# Patient Record
Sex: Female | Born: 1985 | Race: White | Hispanic: No | Marital: Married | Smoking: Current every day smoker
Health system: Southern US, Community
[De-identification: ages and names within clinical notes are randomized; demographics above are authoritative.]

## PROBLEM LIST (undated history)

## (undated) DIAGNOSIS — J45909 Unspecified asthma, uncomplicated: Secondary | ICD-10-CM

## (undated) DIAGNOSIS — F1911 Other psychoactive substance abuse, in remission: Secondary | ICD-10-CM

## (undated) DIAGNOSIS — Z006 Encounter for examination for normal comparison and control in clinical research program: Principal | ICD-10-CM

## (undated) DIAGNOSIS — F17200 Nicotine dependence, unspecified, uncomplicated: Secondary | ICD-10-CM

## (undated) HISTORY — DX: Other psychoactive substance abuse, in remission: F19.11

## (undated) HISTORY — DX: Unspecified asthma, uncomplicated: J45.909

## (undated) HISTORY — DX: Encounter for examination for normal comparison and control in clinical research program: Z00.6

## (undated) HISTORY — DX: Nicotine dependence, unspecified, uncomplicated: F17.200

---

## 2005-02-03 ENCOUNTER — Emergency Department (HOSPITAL_COMMUNITY): Admission: EM | Admit: 2005-02-03 | Discharge: 2005-02-03 | Payer: Self-pay | Admitting: Emergency Medicine

## 2011-06-22 ENCOUNTER — Telehealth: Payer: Self-pay | Admitting: *Deleted

## 2011-08-24 NOTE — Telephone Encounter (Signed)
No note

## 2014-08-07 ENCOUNTER — Emergency Department (HOSPITAL_BASED_OUTPATIENT_CLINIC_OR_DEPARTMENT_OTHER)
Admission: EM | Admit: 2014-08-07 | Discharge: 2014-08-07 | Disposition: A | Discharge status: Home / Self Care | Payer: Self-pay | Attending: Emergency Medicine | Admitting: Emergency Medicine

## 2014-08-07 ENCOUNTER — Encounter (HOSPITAL_BASED_OUTPATIENT_CLINIC_OR_DEPARTMENT_OTHER): Payer: Self-pay | Admitting: Emergency Medicine

## 2014-08-07 DIAGNOSIS — K029 Dental caries, unspecified: Secondary | ICD-10-CM | POA: Insufficient documentation

## 2014-08-07 DIAGNOSIS — Z87891 Personal history of nicotine dependence: Secondary | ICD-10-CM | POA: Insufficient documentation

## 2014-08-07 MED ORDER — PENICILLIN V POTASSIUM 250 MG PO TABS
500.0000 mg | ORAL_TABLET | Freq: Once | ORAL | Status: AC
  Administered 2014-08-07: 500 mg via ORAL

## 2014-08-07 MED ORDER — PENICILLIN V POTASSIUM 500 MG PO TABS: 500.0000 mg | ORAL_TABLET | Freq: Four times a day (QID) | ORAL | Status: AC

## 2014-08-07 MED ORDER — MELOXICAM 15 MG PO TABS: 15.0000 mg | ORAL_TABLET | Freq: Every day | ORAL | Status: DC

## 2014-08-07 MED ORDER — PENICILLIN V POTASSIUM 250 MG PO TABS
ORAL_TABLET | ORAL | Status: AC
  Administered 2014-08-07: 500 mg via ORAL
  Filled 2014-08-07: qty 2

## 2014-08-07 NOTE — ED Notes (Signed)
Pt reports that [facility]

## 2014-08-07 NOTE — ED Notes (Signed)
Pt reports left bottom tooth pain x 2 days, nonresponsive to 800 ibuprofen

## 2014-08-07 NOTE — ED Provider Notes (Signed)
CSN: [id number]     Arrival date & time 08/07/14  0435 History   First MD Initiated Contact with Patient 08/07/14 0446     Chief Complaint  Patient presents with  . Dental Pain     (Consider location/radiation/quality/duration/timing/severity/associated sxs/prior Treatment) Patient is a 29 y.o. female presenting with tooth pain. The history is provided by the patient.  Dental Pain Location:  Lower Lower teeth location:  19/LL 1st molar, 20/LL 2nd bicuspid, 21/LL 1st bicuspid, 22/LL cuspid, 23/LL lateral incisor, 24/LL central incisor, 25/RL central incisor, 26/RL lateral incisor, 27/RL cuspid, 28/RL 1st bicuspid and 29/RL 2nd bicuspid Quality:  Dull Severity:  Severe Onset quality:  Gradual Timing:  Constant Progression:  Worsening Chronicity:  Recurrent Context: poor dentition   Previous work-up:  Dental exam Relieved by:  Nothing Worsened by:  Nothing tried Ineffective treatments:  NSAIDs Associated symptoms: no congestion, no fever, no neck swelling and no trismus   Risk factors: periodontal disease   Risk factors: no alcohol problem     History reviewed. No pertinent past medical history. History reviewed. No pertinent past surgical history. History reviewed. No pertinent family history. History  Substance Use Topics  . Smoking status: Former Smoker -- 0.50 packs/day    Types: Cigarettes  . Smokeless tobacco: Not on file  . Alcohol Use: No   OB History    No data available     Review of Systems  Constitutional: Negative for fever.  HENT: Positive for dental problem. Negative for congestion.   All other systems reviewed and are negative.     Allergies  Review of patient's allergies indicates no known allergies.  Home Medications   Prior to Admission medications   Not on File   BP 134/86 mmHg  Pulse 89  Temp(Src) 98.6 F (37 C) (Oral)  Resp 18  Ht  (1.727 m)  Wt 150 lb (68.04 kg)  BMI 22.81 kg/m2  SpO2 98%  LMP 07/31/2014 Physical Exam   Constitutional: She is oriented to person, place, and time. She appears well-developed and well-nourished. No distress.  HENT:  Head: Normocephalic and atraumatic.  Mouth/Throat: Oropharynx is clear and moist.  Widespread necrosis with teeth at gum line  Eyes: Conjunctivae and EOM are normal. Pupils are equal, round, and reactive to light.  Neck: Normal range of motion. Neck supple. No tracheal deviation present.  Cardiovascular: Normal rate, regular rhythm and intact distal pulses.   Pulmonary/Chest: Effort normal and breath sounds normal. No respiratory distress. She has no wheezes. She has no rales.  Abdominal: Soft. Bowel sounds are normal. There is no tenderness. There is no rebound and no guarding.  Musculoskeletal: Normal range of motion.  Neurological: She is alert and oriented to person, place, and time.  Skin: Skin is warm and dry.  Psychiatric: She has a normal mood and affect.    ED Course  Procedures (including critical care time) Labs Review Labs Reviewed - No data to display  Imaging Review No results found.   EKG Interpretation None      MDM   Final diagnoses:  None    Attempted numbing patient unable to tolerate  PEN VK and mobic and follow up with a dentist   Luqman Perrelli, MD 08/07/14 [phone]

## 2014-08-07 NOTE — Discharge Instructions (Signed)
Dental Care and Dentist Visits  Dental care supports good overall health. Regular dental visits can also help you avoid dental pain, bleeding, infection, and other more serious health problems in the future. It is important to keep the mouth healthy because diseases in the teeth, gums, and other oral tissues can spread to other areas of the body. Some problems, such as diabetes, heart disease, and pre-term labor have been associated with poor oral health.   See your dentist every 6 months. If you experience emergency problems such as a toothache or broken tooth, go to the dentist right away. If you see your dentist regularly, you may catch problems early. It is easier to be treated for problems in the early stages.   WHAT TO EXPECT AT A DENTIST VISIT   Your dentist will look for many common oral health problems and recommend proper treatment. At your regular dental visit, you can expect:  · Gentle cleaning of the teeth and gums. This includes scraping and polishing. This helps to remove the sticky substance around the teeth and gums (plaque). Plaque forms in the mouth shortly after eating. Over time, plaque hardens on the teeth as tartar. If tartar is not removed regularly, it can cause problems. Cleaning also helps remove stains.  · Periodic X-rays. These pictures of the teeth and supporting bone will help your dentist assess the health of your teeth.  · Periodic fluoride treatments. Fluoride is a natural mineral shown to help strengthen teeth. Fluoride treatment involves applying a fluoride gel or varnish to the teeth. It is most commonly done in children.  · Examination of the mouth, tongue, jaws, teeth, and gums to look for any oral health problems, such as:  ¨ Cavities (dental caries). This is decay on the tooth caused by plaque, sugar, and acid in the mouth. It is best to catch a cavity when it is small.  ¨ Inflammation of the gums caused by plaque buildup (gingivitis).  ¨ Problems with the mouth or malformed  or misaligned teeth.  ¨ Oral cancer or other diseases of the soft tissues or jaws.   KEEP YOUR TEETH AND GUMS HEALTHY  For healthy teeth and gums, follow these general guidelines as well as your dentist's specific advice:  · Have your teeth professionally cleaned at the dentist every 6 months.  · Brush twice daily with a fluoride toothpaste.  · Floss your teeth daily.   · Ask your dentist if you need fluoride supplements, treatments, or fluoride toothpaste.  · Eat a healthy diet. Reduce foods and drinks with added sugar.  · Avoid smoking.  TREATMENT FOR ORAL HEALTH PROBLEMS  If you have oral health problems, treatment varies depending on the conditions present in your teeth and gums.  · Your caregiver will most likely recommend good oral hygiene at each visit.  · For cavities, gingivitis, or other oral health disease, your caregiver will perform a procedure to treat the problem. This is typically done at a separate appointment. Sometimes your caregiver will refer you to another dental specialist for specific tooth problems or for surgery.  SEEK IMMEDIATE DENTAL CARE IF:  · You have pain, bleeding, or soreness in the gum, tooth, jaw, or mouth area.  · A permanent tooth becomes loose or separated from the gum socket.  · You experience a blow or injury to the mouth or jaw area.  Document Released: 09/15/2010 Document Revised: 03/28/2011 Document Reviewed: 09/15/2010  ExitCare® Patient Information ©2015 [employer]. This information is not intended to replace advice   given to you by your health care provider. Make sure you discuss any questions you have with your health care provider.

## 2015-03-29 ENCOUNTER — Encounter (HOSPITAL_BASED_OUTPATIENT_CLINIC_OR_DEPARTMENT_OTHER): Payer: Self-pay | Admitting: Emergency Medicine

## 2015-03-29 ENCOUNTER — Emergency Department (HOSPITAL_BASED_OUTPATIENT_CLINIC_OR_DEPARTMENT_OTHER)
Admission: EM | Admit: 2015-03-29 | Discharge: 2015-03-29 | Disposition: A | Discharge status: Home / Self Care | Payer: Self-pay | Attending: Emergency Medicine | Admitting: Emergency Medicine

## 2015-03-29 DIAGNOSIS — Z791 Long term (current) use of non-steroidal anti-inflammatories (NSAID): Secondary | ICD-10-CM | POA: Insufficient documentation

## 2015-03-29 DIAGNOSIS — K029 Dental caries, unspecified: Secondary | ICD-10-CM | POA: Insufficient documentation

## 2015-03-29 DIAGNOSIS — F17210 Nicotine dependence, cigarettes, uncomplicated: Secondary | ICD-10-CM | POA: Insufficient documentation

## 2015-03-29 DIAGNOSIS — K047 Periapical abscess without sinus: Secondary | ICD-10-CM | POA: Insufficient documentation

## 2015-03-29 MED ORDER — OXYCODONE-ACETAMINOPHEN 5-325 MG PO TABS
2.0000 | ORAL_TABLET | Freq: Once | ORAL | Status: AC
Start: 2015-03-29 — End: 2015-03-29
  Administered 2015-03-29: 2 via ORAL
  Filled 2015-03-29: qty 2

## 2015-03-29 MED ORDER — OXYCODONE-ACETAMINOPHEN 5-325 MG PO TABS: 1.0000 | ORAL_TABLET | ORAL | Status: AC | PRN

## 2015-03-29 MED ORDER — AMOXICILLIN 500 MG PO CAPS: 500.0000 mg | ORAL_CAPSULE | Freq: Three times a day (TID) | ORAL | Status: AC

## 2015-03-29 NOTE — ED Notes (Signed)
Patient has left jaw pain and swelling

## 2015-03-29 NOTE — ED Provider Notes (Signed)
CSN: [id number]     Arrival date & time 03/29/15  1509 History   First MD Initiated Contact with Patient 03/29/15 1710     Chief Complaint  Patient presents with  . Dental Pain     (Consider location/radiation/quality/duration/timing/severity/associated sxs/prior Treatment) HPI   This is a 30 year old female who presents emergency Department with chief complaint of dental pain. She has very poor dentition and multiple dental caries. The majority of her bottom teeth are decayed to the, line. She is a current daily smoker. She complains of pain developing at tooth #7. Yesterday morning. At first it was mildly achy, overnight, it has swollen significantly. She claims a severe pressure-like pain and swelling below the tooth. She rates the pain at 10 out of 10. She has had no drainage from the tooth. It feels that there is an abscess present. She denies difficulty swallowing or breathing. She's had multiple dental abscesses before due to poor dentition. Patient is a current daily smoker.   History reviewed. No pertinent past medical history. History reviewed. No pertinent past surgical history. History reviewed. No pertinent family history. Social History  Substance Use Topics  . Smoking status: Current Every Day Smoker -- 0.50 packs/day    Types: Cigarettes  . Smokeless tobacco: None  . Alcohol Use: No   OB History    No data available     Review of Systems  Ten systems reviewed and are negative for acute change, except as noted in the HPI.    Allergies  Review of patient's allergies indicates no known allergies.  Home Medications   Prior to Admission medications   Medication Sig Start Date End Date Taking? Authorizing Provider  amoxicillin (AMOXIL) 500 MG capsule Take 1 capsule (500 mg total) by mouth 3 (three) times daily. 03/29/15   Arthor Captain, PA-C  meloxicam (MOBIC) 15 MG tablet Take 1 tablet (15 mg total) by mouth daily. 08/07/14   April Palumbo, MD    oxyCODONE-acetaminophen (PERCOCET) 5-325 MG tablet Take 1-2 tablets by mouth every 4 (four) hours as needed. 03/29/15   Kasia Trego, PA-C   BP 120/72 mmHg  Pulse 94  Temp(Src) 99.1 F (37.3 C) (Oral)  Resp 18  Ht  (1.727 m)  Wt 73.483 kg  BMI 24.64 kg/m2  SpO2 98% Physical Exam  Constitutional: She is oriented to person, place, and time. She appears well-developed and well-nourished. No distress.  HENT:  Head: Normocephalic and atraumatic.  Mouth/Throat: Uvula is midline. No trismus in the jaw.    Oropharynx clear and moist'  Eyes: Conjunctivae are normal. No scleral icterus.  Neck: Normal range of motion.  Cardiovascular: Normal rate, regular rhythm and normal heart sounds.  Exam reveals no gallop and no friction rub.   No murmur heard. Pulmonary/Chest: Effort normal and breath sounds normal. No respiratory distress.  Abdominal: Soft. Bowel sounds are normal. She exhibits no distension and no mass. There is no tenderness. There is no guarding.  Neurological: She is alert and oriented to person, place, and time.  Skin: Skin is warm and dry. She is not diaphoretic.    ED Course  Procedures (including critical care time) Labs Review Labs Reviewed - No data to display  Imaging Review No results found. I have personally reviewed and evaluated these images and lab results as part of my medical decision-making.   EKG Interpretation None      MDM   Final diagnoses:  Dental infection    Patient with toothache.  No gross  abscess.  Exam unconcerning for Ludwig's angina or spread of infection.  Will treat with penicillin and pain medicine.  Urged patient to follow-up with dentist.       Arthor CaptainAbigail Shaneeka Scarboro, PA-C 03/29/15 1814  Jerelyn ScottMartha Linker, MD 03/29/15 1815

## 2015-03-29 NOTE — Discharge Instructions (Signed)
Dental Abscess A dental abscess is a collection of pus in or around a tooth. CAUSES This condition is caused by a bacterial infection around the root of the tooth that involves the inner part of the tooth (pulp). It may result from:  Severe tooth decay.  Trauma to the tooth that allows bacteria to enter into the pulp, such as a broken or chipped tooth.  Severe gum disease around a tooth. SYMPTOMS Symptoms of this condition include:  Severe pain in and around the infected tooth.  Swelling and redness around the infected tooth, in the mouth, or in the face.  Tenderness.  Pus drainage.  Bad breath.  Bitter taste in the mouth.  Difficulty swallowing.  Difficulty opening the mouth.  Nausea.  Vomiting.  Chills.  Swollen neck glands.  Fever. DIAGNOSIS This condition is diagnosed with examination of the infected tooth. During the exam, your dentist may tap on the infected tooth. Your dentist will also ask about your medical and dental history and may order X-rays. TREATMENT This condition is treated by eliminating the infection. This may be done with:  Antibiotic medicine.  A root canal. This may be performed to save the tooth.  Pulling (extracting) the tooth. This may also involve draining the abscess. This is done if the tooth cannot be saved. HOME CARE INSTRUCTIONS  Take medicines only as directed by your dentist.  If you were prescribed antibiotic medicine, finish all of it even if you start to feel better.  Rinse your mouth (gargle) often with salt water to relieve pain or swelling.  Do not drive or operate heavy machinery while taking pain medicine.  Do not apply heat to the outside of your mouth.  Keep all follow-up visits as directed by your dentist. This is important. SEEK MEDICAL CARE IF:  Your pain is worse and is not helped by medicine. SEEK IMMEDIATE MEDICAL CARE IF:  You have a fever or chills.  Your symptoms suddenly get worse.  You have a  very bad headache.  You have problems breathing or swallowing.  You have trouble opening your mouth.  You have swelling in your neck or around your eye.   This information is not intended to replace advice given to you by your health care provider. Make sure you discuss any questions you have with your health care provider.   Document Released: 01/03/2005 Document Revised: 05/20/2014 Document Reviewed: 12/31/2013 Elsevier Interactive Patient Education 2016 Elsevier Inc.  [facility] [employer]  [employer]  [employer] [facility]  [address]  [place], [place] [address]  Phone [phone]  [facility] [employer] [facility] [place] exemplifies the [employer] vision to improve the health and quality of life of all [place] by Public house managercreating leaders with a passion to care for the underserved and by leading the nation in community-based, service learning oral health education. We are committed to offering comprehensive general dental services for adults, children and special needs patients in a safe, caring and professional setting.  Appointments: Our clinic is open Monday through Friday 8:00 a.m. until 5:00 p.m. The amount of time scheduled for an appointment depends on the patients specific needs. We ask that you keep your appointed time for care or provide 24-hour notice of all appointment changes. Parents or legal guardians must accompany minor children.  Payment for Services: Medicaid and other insurance plans are welcome. Payment for services is due when services are  rendered and may be made by cash or credit card. If you have dental insurance, we will assist you with your claim submission.   Emergencies: Emergency services will be provided Monday through Friday on a walk-in basis. Please arrive early for emergency services. After hours emergency services  will be provided for patients of record as required.  Services:  Medical illustratorComprehensive General Dentistry  Childrens Dentistry  Oral Surgery - Extractions  Root Canals  Sealants and Tooth Colored Fillings  Crowns and Bridges  Dentures and Partial Dentures  Implant Services  Periodontal Services and Cleanings  Cosmetic Building services engineerTooth Whitening  Digital Radiography  3-D/[facility] Imaging

## 2016-10-24 ENCOUNTER — Ambulatory Visit: Payer: Self-pay | Attending: Podiatry | Admitting: Sports Medicine

## 2016-10-24 DIAGNOSIS — M79672 Pain in left foot: Secondary | ICD-10-CM

## 2016-10-24 DIAGNOSIS — M79671 Pain in right foot: Secondary | ICD-10-CM

## 2016-10-24 DIAGNOSIS — M722 Plantar fascial fibromatosis: Secondary | ICD-10-CM

## 2016-10-24 MED ORDER — METHYLPREDNISOLONE 4 MG PO TBPK: ORAL_TABLET | ORAL | 0 refills | Status: AC

## 2016-10-24 MED ORDER — MELOXICAM 15 MG PO TABS: 15.0000 mg | ORAL_TABLET | Freq: Every day | ORAL | 0 refills | Status: AC

## 2016-10-24 NOTE — Patient Instructions (Signed)

## 2016-10-24 NOTE — Progress Notes (Signed)
Subjective: Kelsey Phillips is a 31 y.o. female patient [facility] with complaint of heel pain on the left>right. Patient admits to post static dyskinesia since last Oct after she gained weight. Patient reports that now she still has pain worse in AM and at end of day. Patient has treated this problem with stretching, ice, and insoles with no relief. Reports her PCP gave her oral steroids and shot in hip that helped. Denies any other pedal complaints.   There are no active problems to display for this patient.   Current Outpatient Prescriptions on File Prior to Visit  Medication Sig Dispense Refill  . amoxicillin (AMOXIL) 500 MG capsule Take 1 capsule (500 mg total) by mouth 3 (three) times daily. 30 capsule 0  . oxyCODONE-acetaminophen (PERCOCET) 5-325 MG tablet Take 1-2 tablets by mouth every 4 (four) hours as needed. 20 tablet 0   No current facility-administered medications on file prior to visit.     No Known Allergies  Objective: Physical Exam General: The patient is alert and oriented x3 in no acute distress.  Dermatology: Skin is warm, dry and supple bilateral lower extremities. Nails 1-10 are normal. There is no erythema, edema, no eccymosis, no open lesions present. Integument is otherwise unremarkable.  Vascular: Dorsalis Pedis pulse and Posterior Tibial pulse are 1/4 bilateral. Capillary fill time is immediate to all digits.  Neurological: Grossly intact to light touch with an achilles reflex of +2/5 and a  negative Tinel's sign bilateral.  Musculoskeletal: Tenderness to palpation at the medial calcaneal tubercale and through the insertion of the plantar fascia on the left>right foot. No pain with compression of calcaneus bilateral. No pain with tuning fork to calcaneus bilateral. No pain with calf compression bilateral. There is decreased Ankle joint range of motion bilateral. All other joints range of motion within normal limits bilateral. Strength 5/5 in all groups  bilateral.    Assessment and Plan: Problem List Items Addressed This Visit    None    Visit Diagnoses    Plantar fasciitis    -  Primary   Relevant Medications   meloxicam (MOBIC) 15 MG tablet   methylPREDNISolone (MEDROL DOSEPAK) 4 MG TBPK tablet   Foot pain, bilateral          -Complete examination performed.  -Discussed with patient in detail the condition of plantar fasciitis, how this occurs and general treatment options. Explained both conservative and surgical treatments.  -After oral consent and aseptic prep, injected a mixture containing 1 ml of 2%  plain lidocaine, 1 ml 0.5% plain marcaine, 0.5 ml of kenalog 10 and 0.5 ml of dexamethasone phosphate into left and right heel. Post-injection care discussed with patient.  -Rx Meloxicam to start after Medrol dose pack is completed -Recommended good supportive shoes and advised use of OTC insert. -Explained and dispensed to patient daily stretching exercises. -Recommend patient to ice affected area 1-2x daily. -Patient to return to free clinic as needed  Asencion Islam, DPM

## 2016-10-25 ENCOUNTER — Encounter: Payer: Self-pay | Admitting: Internal Medicine

## 2016-11-02 ENCOUNTER — Encounter: Payer: Self-pay | Admitting: Internal Medicine

## 2016-11-02 ENCOUNTER — Ambulatory Visit (INDEPENDENT_AMBULATORY_CARE_PROVIDER_SITE_OTHER): Payer: Self-pay | Admitting: Internal Medicine

## 2016-11-02 DIAGNOSIS — F1910 Other psychoactive substance abuse, uncomplicated: Secondary | ICD-10-CM

## 2016-11-02 DIAGNOSIS — B182 Chronic viral hepatitis C: Secondary | ICD-10-CM | POA: Insufficient documentation

## 2016-11-02 NOTE — Progress Notes (Signed)
[facility]   CC: consideration for treatment for chronic hepatitis C  HPI:  +Kelsey Phillips is a 31 y.o. female who presents for initial evaluation and management of chronic hepatitis C.  Patient tested positive earlier this year. Hepatitis C-associated risk factors present are: IV drug abuse (details: off IVDU for 1 year). Patient denies multiple sexual partners, sexual contact with person with liver disease. Patient has had other studies performed. Results: hepatitis C RNA by PCR, result: positive. Patient has not had prior treatment for Hepatitis C. Patient does not have a past history of liver disease. Patient does have a family history of liver disease. Patient does not  have associated signs or symptoms related to liver disease.  Labs reviewed and confirm chronic hepatitis C with a positive viral load.   Records reviewed from her PCP and no other labs done yet.        Patient does not have documented immunity to Hepatitis A. Patient does not have documented immunity to Hepatitis B.    Review of Systems:  Constitutional: negative for fatigue and malaise Gastrointestinal: negative for diarrhea Integument/breast: negative for rash All other systems reviewed and are negative       PMH: history of substance abuse  Prior to Admission medications   Medication Sig Start Date End Date Taking? Authorizing Provider  amoxicillin (AMOXIL) 500 MG capsule Take 1 capsule (500 mg total) by mouth 3 (three) times daily. 03/29/15   Arthor Captain, PA-C  meloxicam (MOBIC) 15 MG tablet Take 1 tablet (15 mg total) by mouth daily. 10/24/16   Asencion Islam, DPM  methylPREDNISolone (MEDROL DOSEPAK) 4 MG TBPK tablet Take as instructed 10/24/16   Asencion Islam, DPM  oxyCODONE-acetaminophen (PERCOCET) 5-325 MG tablet Take 1-2 tablets by mouth every 4 (four) hours as needed. 03/29/15   Arthor Captain, PA-C    No Known Allergies  Social History  Substance Use Topics  .  Smoking status: Current Every Day Smoker    Packs/day: 0.50    Types: Cigarettes  . Smokeless tobacco: Not on file  . Alcohol use No    FMH: grandmother with alcoholic cirrhosis and liver cancer   Objective:  Constitutional: in no apparent distress and alert,  Vitals:   11/02/16 1450  Weight: 209 lb (94.8 kg)   Eyes: anicteric Cardiovascular: Cor RRR Respiratory: CTA B; normal respiratory effort Gastrointestinal: Bowel sounds are normal, liver is not enlarged, spleen is not enlarged Musculoskeletal: no pedal edema noted Skin: negatives: no rash; no porphyria cutanea tarda Lymphatic: no cervical lymphadenopathy   Laboratory Genotype: No results found for: HCVGENOTYPE HCV viral load: No results found for: HCVQUANT No results found for: WBC, HGB, HCT, MCV, PLT No results found for: CREATININE, BUN, NA, K, CL, CO2 No results found for: ALT, AST, GGT, ALKPHOS   Labs and history reviewed and show CHILD-PUGH unknown  5-6 points: Child class A 7-9 points: Child class B 10-15 points: Child class C  No results found for: INR, BILITOT, ALBUMIN   Assessment: New Patient with Chronic Hepatitis C genotype unknown, untreated.  I discussed with the patient the lab findings that confirm chronic hepatitis C as well as the natural history and progression of disease including about 30% of people who develop cirrhosis of the liver if left untreated and once cirrhosis is established there is a 2-7% risk per year of liver cancer and liver failure.  I discussed the importance of treatment and benefits in reducing the risk, even if  significant liver fibrosis exists.   Plan: 1) Patient counseled extensively on limiting acetaminophen to no more than 2 grams daily, avoidance of alcohol. 2) Transmission discussed with patient including sexual transmission, sharing razors and toothbrush.   3) Will need referral to gastroenterology if concern for cirrhosis  I discussed our research trial on monitoring  and she is interested and opted to get enrolled in the study so she will continue to follow with our research staff.

## 2016-11-02 NOTE — Patient Instructions (Signed)
Date 11/02/16  Dear Kelsey Phillips, As discussed in the ID Clinic, your hepatitis C therapy will include highly effective medication(s) for treatment and will vary based on the type of hepatitis C and insurance approval.  Potential medications include:          Harvoni (sofosbuvir 90mg /ledipasvir 400mg ) tablet oral daily          OR     Epclusa (sofosbuvir 400mg /velpatasvir 100mg ) tablet oral daily          OR      Mavyret (glecaprevir 100 mg/pibrentasvir 40 mg): Take 3 tablets oral daily          OR     Zepatier (elbasvir 50 mg/grazoprevir 100 mg) oral daily, +/- ribavirin              Medications are typically for 8 or 12 weeks total ---------------------------------------------------------------- Your HCV Treatment Start Date: You will be notified by our office once the medication is approved and where you can pick it up (or if mailed)   ---------------------------------------------------------------- YOUR PHARMACY CONTACT:   [facility] [address] [place], [place] [address] Phone: [phone] Hours: Monday to Friday 7:30 am to 6:00 pm   Please always contact your pharmacy at least 3-4 business days before you run out of medications to ensure your next month's medication is ready or 1 week prior to running out if you receive it by mail.  Remember, each prescription is for 28 days. ---------------------------------------------------------------- GENERAL NOTES REGARDING YOUR HEPATITIS C MEDICATION:  Some medications have the following interactions:  - Acid reducing agents such as H2 blockers (ie. Pepcid (famotidine), Zantac (ranitidine), Tagamet (cimetidine), Axid (nizatidine) and proton pump inhibitors (ie. Prilosec (omeprazole), Protonix (pantoprazole), Nexium (esomeprazole), or Aciphex (rabeprazole)). Do not take until you have discussed with a health care provider.    -Antacids that contain magnesium and/or aluminum hydroxide (ie. Milk of Magensia, Rolaids,  Gaviscon, Maalox, Mylanta, an dArthritis Pain Formula).  -Calcium carbonate (calcium supplements or antacids such as Tums, Caltrate, Os-Cal).  -St. John's wort or any products that contain St. John's wort like some herbal supplements  Please inform the office prior to starting any of these medications.  - The common side effects associated with Harvoni include:      1. Fatigue      2. Headache      3. Nausea      4. Diarrhea      5. Insomnia  Please note that this only lists the most common side effects and is NOT a comprehensive list of the potential side effects of these medications. For more information, please review the drug information sheets that come with your medication package from the pharmacy.  ---------------------------------------------------------------- GENERAL HELPFUL HINTS ON HCV THERAPY: 1. Stay well-hydrated. 2. Notify the ID Clinic of any changes in your other over-the-counter/herbal or prescription medications. 3. If you miss a dose of your medication, take the missed dose as soon as you remember. Return to your regular time/dose schedule the next day.  4.  Do not stop taking your medications without first talking with your healthcare provider. 5.  You may take Tylenol (acetaminophen), as long as the dose is less than 2000 mg (OR no more than 4 tablets of the Tylenol Extra Strengths 500mg  tablet) in 24 hours. 6.  You will see our pharmacist-specialist within the first 2 weeks of starting your medication to monitor for any possible side effects. 7.  You will have labs once during treatment, soon  after treatment completion and one final lab 6 months after treatment completion to verify the virus is out of your system.  Scharlene Gloss, [place] for [place], [place]  [phone] [phone]

## 2016-11-02 NOTE — Assessment & Plan Note (Signed)
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

## 2016-11-16 ENCOUNTER — Encounter: Payer: Self-pay | Admitting: *Deleted

## 2016-11-30 ENCOUNTER — Encounter: Payer: Self-pay | Admitting: Infectious Disease

## 2016-11-30 ENCOUNTER — Telehealth: Payer: Self-pay | Admitting: Infectious Disease

## 2016-11-30 ENCOUNTER — Encounter (INDEPENDENT_AMBULATORY_CARE_PROVIDER_SITE_OTHER): Payer: Self-pay | Admitting: Infectious Disease

## 2016-11-30 VITALS — BP 120/74 | HR 80 | Temp 98.2°F | Wt 216.0 lb

## 2016-11-30 DIAGNOSIS — B182 Chronic viral hepatitis C: Secondary | ICD-10-CM

## 2016-11-30 DIAGNOSIS — Z87898 Personal history of other specified conditions: Secondary | ICD-10-CM

## 2016-11-30 DIAGNOSIS — J45909 Unspecified asthma, uncomplicated: Secondary | ICD-10-CM | POA: Insufficient documentation

## 2016-11-30 DIAGNOSIS — F17200 Nicotine dependence, unspecified, uncomplicated: Secondary | ICD-10-CM

## 2016-11-30 DIAGNOSIS — Z006 Encounter for examination for normal comparison and control in clinical research program: Secondary | ICD-10-CM

## 2016-11-30 DIAGNOSIS — F1911 Other psychoactive substance abuse, in remission: Secondary | ICD-10-CM

## 2016-11-30 HISTORY — DX: Unspecified asthma, uncomplicated: J45.909

## 2016-11-30 HISTORY — DX: Personal history of other specified conditions: Z87.898

## 2016-11-30 HISTORY — DX: Other psychoactive substance abuse, in remission: F19.11

## 2016-11-30 HISTORY — DX: Nicotine dependence, unspecified, uncomplicated: F17.200

## 2016-11-30 HISTORY — DX: Encounter for examination for normal comparison and control in clinical research program: Z00.6

## 2016-11-30 NOTE — Progress Notes (Signed)
Kelsey Phillips is here for screening visit for A5360, A Single-arm Study to Evaluate the Feasibility and Efficacy of a Minimal Strategy to Deliver Pan-genotypic Ribavirin-free HCV Therapy to HCV Infected Populations who are HCV Treatment naive with Evidence of Active HCV Infection: [facility]. Informed consent was obtained after we reviewed the responsibilities, risks and possible benefits of the study. States that she probably acquired HCV through prior IV drug use. States that she has been clean and not used any IV drugs in 11 months. Has good support from her boyfriend. States that she has never received any treatment for her HCV. CPE done today per Dr. Daiva EvesVan Dam. We have scheduled an US with elastograohy for next week and plan to enroll into study the following week if eligible.

## 2016-11-30 NOTE — Addendum Note (Signed)
Addended by: Osvaldo ShipperASNOIT, Briggs Edelen H on: 11/30/2016 03:44 PM   Modules accepted: Orders

## 2016-11-30 NOTE — Progress Notes (Signed)
Subjective:    Patient ID: Kelsey Phillips, female    DOB: 01/20/1985, 31 y.o.   MRN: [id number]  HPI  Kelsey Phillips is here for an exam for the [facility] MINMON study of Hepatitis C. he has a history of chronic hepatitis C without hepatic coma having acquired this through prior intravenous drug use.  She states she has been clean from IV drugs times 11 months.  She has comorbid asthma and smoking.  She has had baseline labs performed and is here for her exam [facility].  Her husband is also coming to clinic today for screening as well.  She has a little bit of a cough that she attributes to her chronic smoking and asthma but otherwise no physical complaints today.      Past Medical History:  Diagnosis Date  . Asthma 11/30/2016  . Examination of participant in clinical trial 11/30/2016  . History of intravenous drug abuse 11/30/2016  . Smoker 11/30/2016  Chronic hepatitis C without hepatic coma [facility]  No past surgical history on file.  No family history on file.    Social History   Socioeconomic History  . Marital status: Single    Spouse name: Not on file  . Number of children: Not on file  . Years of education: Not on file  . Highest education level: Not on file  Social Needs  . Financial resource strain: Not on file  . Food insecurity - worry: Not on file  . Food insecurity - inability: Not on file  . Transportation needs - medical: Not on file  . Transportation needs - non-medical: Not on file  Occupational History  . Not on file  Tobacco Use  . Smoking status: Current Every Day Smoker    Packs/day: 0.50    Types: Cigarettes  . Smokeless tobacco: Never Used  Substance and Sexual Activity  . Alcohol use: No  . Drug use: Not on file  . Sexual activity: Not on file  Other Topics Concern  . Not on file  Social History Narrative  . Not on file    No Known Allergies   Current Outpatient Medications:  .  amoxicillin  (AMOXIL) 500 MG capsule, Take 1 capsule (500 mg total) by mouth 3 (three) times daily. (Patient not taking: Reported on 11/02/2016), Disp: 30 capsule, Rfl: 0 .  gabapentin (NEURONTIN) 300 MG capsule, Take 300 mg by mouth 2 (two) times daily., Disp: , Rfl: 0 .  lamoTRIgine (LAMICTAL) 25 MG tablet, TAKE 2 Tablets BY MOUTH EVERY MORNING AND TAKE 3 Tablets BY MOUTH EVERY NIGHT AT BEDTIME, Disp: , Rfl: 1 .  meloxicam (MOBIC) 15 MG tablet, Take 1 tablet (15 mg total) by mouth daily., Disp: 30 tablet, Rfl: 0 .  methylPREDNISolone (MEDROL DOSEPAK) 4 MG TBPK tablet, Take as instructed, Disp: 21 tablet, Rfl: 0 .  oxyCODONE-acetaminophen (PERCOCET) 5-325 MG tablet, Take 1-2 tablets by mouth every 4 (four) hours as needed. (Patient not taking: Reported on 11/02/2016), Disp: 20 tablet, Rfl: 0 .  PROVENTIL HFA 108 (90 Base) MCG/ACT inhaler, INHALE 2 PUFFS BY MOUTH FOUR TIMES DAILY AS NEEDED FOR COUGHING, WHEEZING, OR SHORTNESS OF BREATH, Disp: , Rfl: 0     Review of Systems  Constitutional: Negative for activity change, appetite change, chills, diaphoresis, fatigue, fever and unexpected weight change.  HENT: Negative for congestion, rhinorrhea, sinus pressure, sneezing, sore throat and trouble swallowing.   Eyes: Negative for photophobia and visual disturbance.  Respiratory: Positive for cough and wheezing. Negative for chest tightness, shortness of breath and stridor.   Cardiovascular: Negative for chest pain, palpitations and leg swelling.  Gastrointestinal: Negative for abdominal distention, abdominal pain, anal bleeding, blood in stool, constipation, diarrhea, nausea and vomiting.  Genitourinary: Negative for difficulty urinating, dysuria, flank pain and hematuria.  Musculoskeletal: Negative for arthralgias, back pain, gait problem, joint swelling and myalgias.  Skin: Negative for color change, pallor, rash and wound.  Neurological: Negative for dizziness, tremors, weakness and light-headedness.    Hematological: Negative for adenopathy. Does not bruise/bleed easily.  Psychiatric/Behavioral: Negative for agitation, behavioral problems, confusion, decreased concentration, dysphoric mood and sleep disturbance.       Objective:   Physical Exam  Constitutional: She is oriented to person, place, and time. She appears well-developed and well-nourished. No distress.  HENT:  Head: Normocephalic and atraumatic.  Mouth/Throat: No oropharyngeal exudate.  Eyes: Conjunctivae and EOM are normal. Pupils are equal, round, and reactive to light. Right eye exhibits no discharge. Left eye exhibits no discharge. No scleral icterus.  Neck: Normal range of motion. Neck supple. No thyromegaly present.  Cardiovascular: Normal rate, regular rhythm and normal heart sounds. Exam reveals no gallop and no friction rub.  No murmur heard. Pulmonary/Chest: Effort normal and breath sounds normal. No respiratory distress. She has no wheezes. She has no rales. She exhibits no tenderness.  Abdominal: Soft. Bowel sounds are normal. She exhibits no distension. There is no hepatosplenomegaly. There is no tenderness. There is no rebound and no guarding.  Musculoskeletal: She exhibits no edema or tenderness.  Lymphadenopathy:       Head (right side): No submental, no submandibular, no tonsillar, no preauricular, no posterior auricular and no occipital adenopathy present.       Head (left side): No submental, no submandibular, no tonsillar, no preauricular, no posterior auricular and no occipital adenopathy present.    She has no cervical adenopathy.       Right: No supraclavicular adenopathy present.       Left: No supraclavicular adenopathy present.  Neurological: She is alert and oriented to person, place, and time. She displays no tremor. No cranial nerve deficit or sensory deficit. She exhibits normal muscle tone. Coordination and gait normal. GCS eye subscore is 4. GCS verbal subscore is 5. GCS motor subscore is 6.   Skin: Skin is warm and dry. No rash noted. She is not diaphoretic. No erythema. No pallor.  Psychiatric: She has a normal mood and affect. Her behavior is normal. Judgment and thought content normal.          Assessment & Plan:   Normal exam with exception of a few wheezes on pulmonary exam. I counseled her on importance of avoiding hepatotoxins and maintaining her sobriety and abstinence from intravenous drug use both for the purposes of not reacquiring hepatitis C but also for the purposes of not developing endocarditis again.

## 2016-12-01 LAB — HCG, SERUM, QUALITATIVE: Preg, Serum: NEGATIVE

## 2016-12-02 LAB — COMPREHENSIVE METABOLIC PANEL
AG Ratio: 1.3 (calc) (ref 1.0–2.5)
ALBUMIN MSPROF: 4.1 g/dL (ref 3.6–5.1)
ALT: 50 U/L — ABNORMAL HIGH (ref 6–29)
AST: 51 U/L — ABNORMAL HIGH (ref 10–30)
Alkaline phosphatase (APISO): 70 U/L (ref 33–115)
BUN: 10 mg/dL (ref 7–25)
CALCIUM: 9.4 mg/dL (ref 8.6–10.2)
CHLORIDE: 99 mmol/L (ref 98–110)
CO2: 24 mmol/L (ref 20–32)
Creat: 0.82 mg/dL (ref 0.50–1.10)
GLUCOSE: 95 mg/dL (ref 65–99)
Globulin: 3.1 g/dL (calc) (ref 1.9–3.7)
Potassium: 4.5 mmol/L (ref 3.5–5.3)
Sodium: 139 mmol/L (ref 135–146)
TOTAL PROTEIN: 7.2 g/dL (ref 6.1–8.1)
Total Bilirubin: 0.3 mg/dL (ref 0.2–1.2)

## 2016-12-02 LAB — HEPATITIS B CORE ANTIBODY, TOTAL: Hep B Core Total Ab: NONREACTIVE

## 2016-12-02 LAB — CBC WITH DIFFERENTIAL/PLATELET
BASOS ABS: 29 {cells}/uL (ref 0–200)
Basophils Relative: 0.3 %
Eosinophils Absolute: 291 cells/uL (ref 15–500)
Eosinophils Relative: 3 %
HCT: 42.9 % (ref 35.0–45.0)
Hemoglobin: 14.6 g/dL (ref 11.7–15.5)
LYMPHS ABS: 3676 {cells}/uL (ref 850–3900)
MCH: 29.7 pg (ref 27.0–33.0)
MCHC: 34 g/dL (ref 32.0–36.0)
MCV: 87.2 fL (ref 80.0–100.0)
MONOS PCT: 7.9 %
MPV: 10.2 fL (ref 7.5–12.5)
Neutro Abs: 4937 cells/uL (ref 1500–7800)
Neutrophils Relative %: 50.9 %
PLATELETS: 253 10*3/uL (ref 140–400)
RBC: 4.92 10*6/uL (ref 3.80–5.10)
RDW: 12 % (ref 11.0–15.0)
TOTAL LYMPHOCYTE: 37.9 %
WBC mixed population: 766 cells/uL (ref 200–950)
WBC: 9.7 10*3/uL (ref 3.8–10.8)

## 2016-12-02 LAB — HEPATITIS C RNA QUANTITATIVE
HCV Quantitative Log: 4.98 Log IU/mL — ABNORMAL HIGH
HCV RNA, PCR, QN: 94800 IU/mL — ABNORMAL HIGH

## 2016-12-02 LAB — PROTIME-INR
INR: 1
Prothrombin Time: 10.4 s (ref 9.0–11.5)

## 2016-12-02 LAB — HEPATITIS B SURFACE ANTIGEN: Hepatitis B Surface Ag: NONREACTIVE

## 2016-12-02 LAB — HEPATITIS B SURFACE ANTIBODY,QUALITATIVE: Hep B S Ab: REACTIVE — AB

## 2016-12-02 LAB — HIV ANTIBODY (ROUTINE TESTING W REFLEX): HIV 1&2 Ab, 4th Generation: NONREACTIVE

## 2016-12-06 ENCOUNTER — Ambulatory Visit (HOSPITAL_COMMUNITY)
Admission: RE | Admit: 2016-12-06 | Discharge: 2016-12-06 | Disposition: A | Discharge status: Home / Self Care | Payer: Self-pay | Source: Ambulatory Visit | Attending: Infectious Disease | Admitting: Infectious Disease

## 2016-12-06 DIAGNOSIS — R161 Splenomegaly, not elsewhere classified: Secondary | ICD-10-CM | POA: Insufficient documentation

## 2016-12-06 DIAGNOSIS — Z006 Encounter for examination for normal comparison and control in clinical research program: Secondary | ICD-10-CM | POA: Insufficient documentation

## 2016-12-06 DIAGNOSIS — B1920 Unspecified viral hepatitis C without hepatic coma: Secondary | ICD-10-CM | POA: Insufficient documentation

## 2016-12-15 ENCOUNTER — Encounter (INDEPENDENT_AMBULATORY_CARE_PROVIDER_SITE_OTHER): Payer: Self-pay | Admitting: *Deleted

## 2016-12-15 VITALS — BP 117/73 | HR 80 | Temp 98.2°F | Wt 219.5 lb

## 2016-12-15 DIAGNOSIS — Z006 Encounter for examination for normal comparison and control in clinical research program: Secondary | ICD-10-CM

## 2016-12-15 LAB — POCT URINE PREGNANCY: Preg Test, Ur: NEGATIVE

## 2016-12-15 LAB — CBC WITH DIFFERENTIAL/PLATELET
BASOS ABS: 33 {cells}/uL (ref 0–200)
Basophils Relative: 0.4 %
Eosinophils Absolute: 418 cells/uL (ref 15–500)
Eosinophils Relative: 5.1 %
HEMATOCRIT: 43.5 % (ref 35.0–45.0)
Hemoglobin: 14.7 g/dL (ref 11.7–15.5)
LYMPHS ABS: 3018 {cells}/uL (ref 850–3900)
MCH: 29.6 pg (ref 27.0–33.0)
MCHC: 33.8 g/dL (ref 32.0–36.0)
MCV: 87.7 fL (ref 80.0–100.0)
MPV: 10 fL (ref 7.5–12.5)
Monocytes Relative: 7.5 %
NEUTROS PCT: 50.2 %
Neutro Abs: 4116 cells/uL (ref 1500–7800)
Platelets: 255 10*3/uL (ref 140–400)
RBC: 4.96 10*6/uL (ref 3.80–5.10)
RDW: 11.8 % (ref 11.0–15.0)
Total Lymphocyte: 36.8 %
WBC: 8.2 10*3/uL (ref 3.8–10.8)
WBCMIX: 615 {cells}/uL (ref 200–950)

## 2016-12-15 LAB — COMPREHENSIVE METABOLIC PANEL
AG Ratio: 1.3 (calc) (ref 1.0–2.5)
ALT: 42 U/L — AB (ref 6–29)
AST: 34 U/L — AB (ref 10–30)
Albumin: 4.1 g/dL (ref 3.6–5.1)
Alkaline phosphatase (APISO): 72 U/L (ref 33–115)
BILIRUBIN TOTAL: 0.3 mg/dL (ref 0.2–1.2)
BUN: 10 mg/dL (ref 7–25)
CALCIUM: 9.5 mg/dL (ref 8.6–10.2)
CO2: 32 mmol/L (ref 20–32)
CREATININE: 0.8 mg/dL (ref 0.50–1.10)
Chloride: 99 mmol/L (ref 98–110)
Globulin: 3.2 g/dL (calc) (ref 1.9–3.7)
Glucose, Bld: 104 mg/dL — ABNORMAL HIGH (ref 65–99)
POTASSIUM: 4 mmol/L (ref 3.5–5.3)
Sodium: 137 mmol/L (ref 135–146)
Total Protein: 7.3 g/dL (ref 6.1–8.1)

## 2016-12-15 NOTE — Progress Notes (Unsigned)
Fleet ContrasRachel is here for entry visit for A5360, A Single-arm Study to Evaluate the Feasibility and Efficacy of a Minimal Monitoring Strategy to Deliver Pan-genotypic Ribavirin-free HCV Infected Populations who are HCV Treatment Naive with Evidence of Active HCV Infection: [facility]. After confirming eligibility she was randomized and blood work obtained. No new complaint or concerns. Reviewed proper dosing of medication along with potential side effects. Agreed to call if she had any questions or concerns. Initial dose of SOL/VEL observed. Remote visit/telephone follow-up scheduled for 4 weeks (01/06/17 at 9am).

## 2017-01-04 ENCOUNTER — Other Ambulatory Visit: Payer: Self-pay | Admitting: *Deleted

## 2017-01-04 DIAGNOSIS — Z006 Encounter for examination for normal comparison and control in clinical research program: Secondary | ICD-10-CM

## 2017-05-30 ENCOUNTER — Ambulatory Visit (HOSPITAL_COMMUNITY): Payer: Self-pay

## 2017-05-30 ENCOUNTER — Ambulatory Visit (HOSPITAL_COMMUNITY)
Admission: RE | Admit: 2017-05-30 | Discharge: 2017-05-30 | Disposition: A | Discharge status: Home / Self Care | Payer: Self-pay | Source: Ambulatory Visit | Attending: Infectious Disease | Admitting: Infectious Disease

## 2017-05-30 DIAGNOSIS — R932 Abnormal findings on diagnostic imaging of liver and biliary tract: Secondary | ICD-10-CM | POA: Insufficient documentation

## 2017-05-30 DIAGNOSIS — Z006 Encounter for examination for normal comparison and control in clinical research program: Secondary | ICD-10-CM | POA: Insufficient documentation

## 2017-06-01 ENCOUNTER — Encounter (INDEPENDENT_AMBULATORY_CARE_PROVIDER_SITE_OTHER): Payer: Self-pay | Admitting: *Deleted

## 2017-06-01 VITALS — BP 118/77 | HR 88 | Temp 98.1°F | Wt 214.0 lb

## 2017-06-01 DIAGNOSIS — Z006 Encounter for examination for normal comparison and control in clinical research program: Secondary | ICD-10-CM

## 2017-06-01 NOTE — Progress Notes (Signed)
Kelsey Phillips is here for her week 24 visit for A5360. States that she had mild nausea and fatigue for 2-3 days when she started the study medication. After that she had no adverse effects and no missed doses. States that she "feels great" since completing study treatment. Had her [place] with elastography 2 days ago. No new problems or medications. She will return in October for her next study visit and [place] w/elastography.

## 2017-06-02 LAB — CBC WITH DIFFERENTIAL/PLATELET
Basophils Absolute: 40 cells/uL (ref 0–200)
Basophils Relative: 0.4 %
EOS PCT: 2.5 %
Eosinophils Absolute: 248 cells/uL (ref 15–500)
HCT: 43.5 % (ref 35.0–45.0)
Hemoglobin: 15.5 g/dL (ref 11.7–15.5)
Lymphs Abs: 3396 cells/uL (ref 850–3900)
MCH: 30.1 pg (ref 27.0–33.0)
MCHC: 35.6 g/dL (ref 32.0–36.0)
MCV: 84.5 fL (ref 80.0–100.0)
MPV: 10.3 fL (ref 7.5–12.5)
Monocytes Relative: 6.4 %
NEUTROS PCT: 56.4 %
Neutro Abs: 5584 cells/uL (ref 1500–7800)
Platelets: 263 10*3/uL (ref 140–400)
RBC: 5.15 10*6/uL — ABNORMAL HIGH (ref 3.80–5.10)
RDW: 12.2 % (ref 11.0–15.0)
TOTAL LYMPHOCYTE: 34.3 %
WBC mixed population: 634 cells/uL (ref 200–950)
WBC: 9.9 10*3/uL (ref 3.8–10.8)

## 2017-06-02 LAB — COMPREHENSIVE METABOLIC PANEL
AG Ratio: 1.3 (calc) (ref 1.0–2.5)
ALT: 13 U/L (ref 6–29)
AST: 20 U/L (ref 10–30)
Albumin: 4.4 g/dL (ref 3.6–5.1)
Alkaline phosphatase (APISO): 81 U/L (ref 33–115)
BILIRUBIN TOTAL: 0.3 mg/dL (ref 0.2–1.2)
BUN: 10 mg/dL (ref 7–25)
CALCIUM: 9.9 mg/dL (ref 8.6–10.2)
CO2: 31 mmol/L (ref 20–32)
Chloride: 99 mmol/L (ref 98–110)
Creat: 0.91 mg/dL (ref 0.50–1.10)
GLUCOSE: 112 mg/dL — AB (ref 65–99)
Globulin: 3.3 g/dL (calc) (ref 1.9–3.7)
Potassium: 4 mmol/L (ref 3.5–5.3)
Sodium: 137 mmol/L (ref 135–146)
TOTAL PROTEIN: 7.7 g/dL (ref 6.1–8.1)

## 2017-06-02 LAB — PROTIME-INR
INR: 1
Prothrombin Time: 10.7 s (ref 9.0–11.5)

## 2017-06-13 ENCOUNTER — Encounter: Payer: Self-pay | Admitting: *Deleted

## 2017-06-13 LAB — HEPATITIS C RNA QUANTITATIVE

## 2017-09-29 ENCOUNTER — Other Ambulatory Visit: Payer: Self-pay | Admitting: *Deleted

## 2017-09-29 DIAGNOSIS — Z006 Encounter for examination for normal comparison and control in clinical research program: Secondary | ICD-10-CM

## 2017-09-29 NOTE — Progress Notes (Signed)
Called [name] today to remind her and Kelsey Phillips of the week 48 study visit and elastography . She said they were both doing fine  And no new problems. They did get married on August 5th!  We are planning on the elastography for 10/24 and the visit to follow the same day.

## 2017-10-16 ENCOUNTER — Ambulatory Visit (HOSPITAL_COMMUNITY): Payer: Self-pay

## 2017-11-09 ENCOUNTER — Ambulatory Visit (HOSPITAL_COMMUNITY): Payer: Self-pay

## 2017-11-09 ENCOUNTER — Encounter: Payer: Self-pay | Admitting: *Deleted

## 2017-11-22 ENCOUNTER — Encounter (INDEPENDENT_AMBULATORY_CARE_PROVIDER_SITE_OTHER): Payer: Self-pay | Admitting: *Deleted

## 2017-11-22 ENCOUNTER — Ambulatory Visit (HOSPITAL_COMMUNITY)
Admission: RE | Admit: 2017-11-22 | Discharge: 2017-11-22 | Disposition: A | Discharge status: Home / Self Care | Payer: Self-pay | Source: Ambulatory Visit | Attending: Infectious Disease | Admitting: Infectious Disease

## 2017-11-22 VITALS — BP 109/72 | HR 91 | Temp 98.2°F | Wt 212.5 lb

## 2017-11-22 DIAGNOSIS — Z006 Encounter for examination for normal comparison and control in clinical research program: Secondary | ICD-10-CM

## 2017-11-22 NOTE — Research (Signed)
Kelsey Phillips is here for her week 48 visit for A5360, The MINMON study.  No new complaints or medications. States that she feels "great". She had her abdominal ultrasound with elastography this morning.  Remote/telephone follow-up visit scheduled for March.

## 2017-11-23 ENCOUNTER — Ambulatory Visit (HOSPITAL_COMMUNITY): Payer: Self-pay

## 2017-11-23 LAB — COMPREHENSIVE METABOLIC PANEL
AG RATIO: 1.4 (calc) (ref 1.0–2.5)
ALBUMIN MSPROF: 4.2 g/dL (ref 3.6–5.1)
ALT: 15 U/L (ref 6–29)
AST: 19 U/L (ref 10–30)
Alkaline phosphatase (APISO): 66 U/L (ref 33–115)
BUN: 9 mg/dL (ref 7–25)
CHLORIDE: 101 mmol/L (ref 98–110)
CO2: 29 mmol/L (ref 20–32)
Calcium: 9.2 mg/dL (ref 8.6–10.2)
Creat: 0.88 mg/dL (ref 0.50–1.10)
GLOBULIN: 2.9 g/dL (ref 1.9–3.7)
GLUCOSE: 120 mg/dL — AB (ref 65–99)
Potassium: 3.8 mmol/L (ref 3.5–5.3)
Sodium: 139 mmol/L (ref 135–146)
Total Bilirubin: 0.4 mg/dL (ref 0.2–1.2)
Total Protein: 7.1 g/dL (ref 6.1–8.1)

## 2017-11-23 LAB — PROTIME-INR
INR: 1
Prothrombin Time: 10.7 s (ref 9.0–11.5)

## 2017-11-23 LAB — CBC WITH DIFFERENTIAL/PLATELET
Basophils Absolute: 18 cells/uL (ref 0–200)
Basophils Relative: 0.2 %
EOS ABS: 359 {cells}/uL (ref 15–500)
Eosinophils Relative: 3.9 %
HCT: 41.6 % (ref 35.0–45.0)
Hemoglobin: 14.5 g/dL (ref 11.7–15.5)
Lymphs Abs: 2999 cells/uL (ref 850–3900)
MCH: 29.2 pg (ref 27.0–33.0)
MCHC: 34.9 g/dL (ref 32.0–36.0)
MCV: 83.9 fL (ref 80.0–100.0)
MPV: 10.2 fL (ref 7.5–12.5)
Monocytes Relative: 6.1 %
NEUTROS PCT: 57.2 %
Neutro Abs: 5262 cells/uL (ref 1500–7800)
PLATELETS: 258 10*3/uL (ref 140–400)
RBC: 4.96 10*6/uL (ref 3.80–5.10)
RDW: 12.1 % (ref 11.0–15.0)
TOTAL LYMPHOCYTE: 32.6 %
WBC: 9.2 10*3/uL (ref 3.8–10.8)
WBCMIX: 561 {cells}/uL (ref 200–950)

## 2017-12-01 LAB — HCV RNA PCR QN RFX NS3/4A: HCV RNA (IU/mL): NOT DETECTED

## 2018-04-05 ENCOUNTER — Other Ambulatory Visit: Payer: Self-pay | Admitting: *Deleted

## 2018-04-05 DIAGNOSIS — Z006 Encounter for examination for normal comparison and control in clinical research program: Secondary | ICD-10-CM

## 2018-04-12 ENCOUNTER — Encounter: Payer: Self-pay | Admitting: *Deleted

## 2018-08-06 ENCOUNTER — Other Ambulatory Visit: Payer: Self-pay | Admitting: *Deleted

## 2018-08-06 DIAGNOSIS — Z006 Encounter for examination for normal comparison and control in clinical research program: Secondary | ICD-10-CM

## 2018-08-16 ENCOUNTER — Telehealth: Payer: Self-pay | Admitting: *Deleted

## 2018-08-16 NOTE — Telephone Encounter (Signed)
Ezell called and said she thought she should cancel her and Phillip's appts. For tomorrow since they are now both sick with cold sx. She started feeling ill Monday and had fevers up to 101 and now it is mainly a cough. But Doren Custard has now come down with the same sx. Today. They were both at the beach week before last in public. I cautioned her about possibility of having covid 19 and they both needed to be tested, quarantining and sending them to [facility] test site today. She will watch Doren Custard closely since he does have COPD. We will reschedule research visits and imaging when they have cleared their sx.

## 2018-08-17 ENCOUNTER — Ambulatory Visit (HOSPITAL_COMMUNITY): Payer: Self-pay

## 2018-08-17 ENCOUNTER — Encounter: Payer: Self-pay | Admitting: *Deleted

## 2018-09-04 ENCOUNTER — Other Ambulatory Visit: Payer: Self-pay | Admitting: *Deleted

## 2018-09-04 DIAGNOSIS — Z006 Encounter for examination for normal comparison and control in clinical research program: Secondary | ICD-10-CM

## 2018-09-10 ENCOUNTER — Ambulatory Visit (HOSPITAL_COMMUNITY): Payer: Self-pay

## 2018-09-10 ENCOUNTER — Encounter: Payer: Self-pay | Admitting: *Deleted

## 2018-09-18 ENCOUNTER — Ambulatory Visit (HOSPITAL_COMMUNITY): Payer: Self-pay

## 2018-09-18 ENCOUNTER — Encounter: Payer: Self-pay | Admitting: *Deleted

## 2018-09-26 ENCOUNTER — Ambulatory Visit (HOSPITAL_COMMUNITY): Admission: RE | Admit: 2018-09-26 | Payer: Self-pay | Source: Ambulatory Visit

## 2018-09-26 ENCOUNTER — Encounter: Payer: Self-pay | Admitting: *Deleted

## 2018-10-02 ENCOUNTER — Other Ambulatory Visit: Payer: Self-pay

## 2018-10-02 ENCOUNTER — Encounter (INDEPENDENT_AMBULATORY_CARE_PROVIDER_SITE_OTHER): Payer: Self-pay | Admitting: *Deleted

## 2018-10-02 ENCOUNTER — Ambulatory Visit (HOSPITAL_COMMUNITY)
Admission: RE | Admit: 2018-10-02 | Discharge: 2018-10-02 | Disposition: A | Discharge status: Home / Self Care | Payer: Self-pay | Source: Ambulatory Visit | Attending: Infectious Disease | Admitting: Infectious Disease

## 2018-10-02 VITALS — BP 123/79 | HR 80 | Temp 99.2°F | Wt 227.0 lb

## 2018-10-02 DIAGNOSIS — Z006 Encounter for examination for normal comparison and control in clinical research program: Secondary | ICD-10-CM

## 2018-10-02 LAB — CBC WITH DIFFERENTIAL/PLATELET
Absolute Monocytes: 519 cells/uL (ref 200–950)
Basophils Absolute: 27 cells/uL (ref 0–200)
Basophils Relative: 0.3 %
Eosinophils Absolute: 173 cells/uL (ref 15–500)
Eosinophils Relative: 1.9 %
HCT: 44.4 % (ref 35.0–45.0)
Hemoglobin: 15 g/dL (ref 11.7–15.5)
Lymphs Abs: 3185 cells/uL (ref 850–3900)
MCH: 29.1 pg (ref 27.0–33.0)
MCHC: 33.8 g/dL (ref 32.0–36.0)
MCV: 86.2 fL (ref 80.0–100.0)
MPV: 9.8 fL (ref 7.5–12.5)
Monocytes Relative: 5.7 %
Neutro Abs: 5196 cells/uL (ref 1500–7800)
Neutrophils Relative %: 57.1 %
Platelets: 245 10*3/uL (ref 140–400)
RBC: 5.15 10*6/uL — ABNORMAL HIGH (ref 3.80–5.10)
RDW: 12.3 % (ref 11.0–15.0)
Total Lymphocyte: 35 %
WBC: 9.1 10*3/uL (ref 3.8–10.8)

## 2018-10-02 LAB — COMPREHENSIVE METABOLIC PANEL
AG Ratio: 1.4 (calc) (ref 1.0–2.5)
ALT: 18 U/L (ref 6–29)
AST: 19 U/L (ref 10–30)
Albumin: 4.2 g/dL (ref 3.6–5.1)
Alkaline phosphatase (APISO): 72 U/L (ref 31–125)
BUN: 10 mg/dL (ref 7–25)
CO2: 30 mmol/L (ref 20–32)
Calcium: 9.7 mg/dL (ref 8.6–10.2)
Chloride: 102 mmol/L (ref 98–110)
Creat: 0.91 mg/dL (ref 0.50–1.10)
Globulin: 3 g/dL (calc) (ref 1.9–3.7)
Glucose, Bld: 95 mg/dL (ref 65–99)
Potassium: 4.1 mmol/L (ref 3.5–5.3)
Sodium: 137 mmol/L (ref 135–146)
Total Bilirubin: 0.5 mg/dL (ref 0.2–1.2)
Total Protein: 7.2 g/dL (ref 6.1–8.1)

## 2018-10-02 LAB — PROTIME-INR
INR: 1
Prothrombin Time: 10.7 s (ref 9.0–11.5)

## 2018-10-02 NOTE — Research (Signed)
Kelsey Phillips is here for her final visit for A5360. She had here abdominal ultrasound with elastography earlier this morning. Recently treated for pneumonia. She was tested for COVID which came back negative. She is feeling much better but states she still has an occasional cough. Encouraged to establish care with a PCP for continual follow-up. Provided her with information for [facility].

## 2018-10-17 LAB — HCV RNA PCR QN RFX NS3/4A: HCV RNA (IU/mL): <15 Iu/mL (ref ?–15)

## 2019-05-28 IMAGING — US US ABDOMEN COMPLETE W/ ELASTOGRAPHY
1 series · 13 of 15 positions shown · non-contrast
Comparison: Ultrasound 12/06/2016

CLINICAL DATA: Research study.  History of IV drug abuse.



[Series 1: us abdomen complete w/ elastography · 0.15mm/px · 13 of 15 slices shown]
[im 1/15]
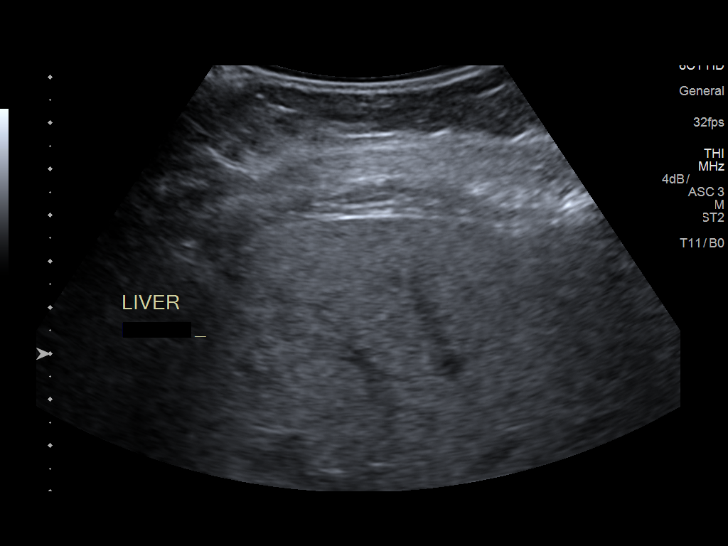
[im 2/15]
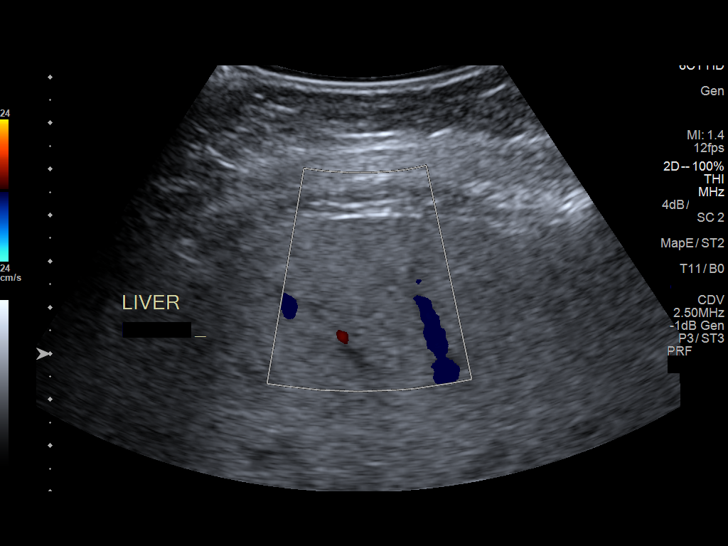
[im 3/15]
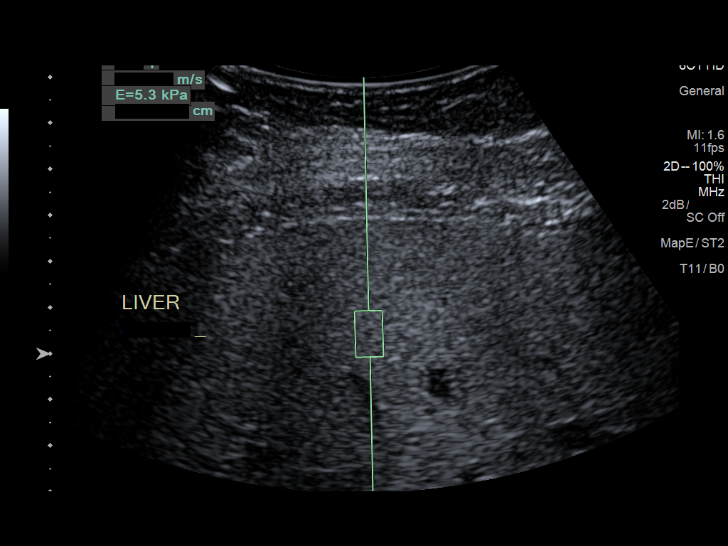
[im 5/15]
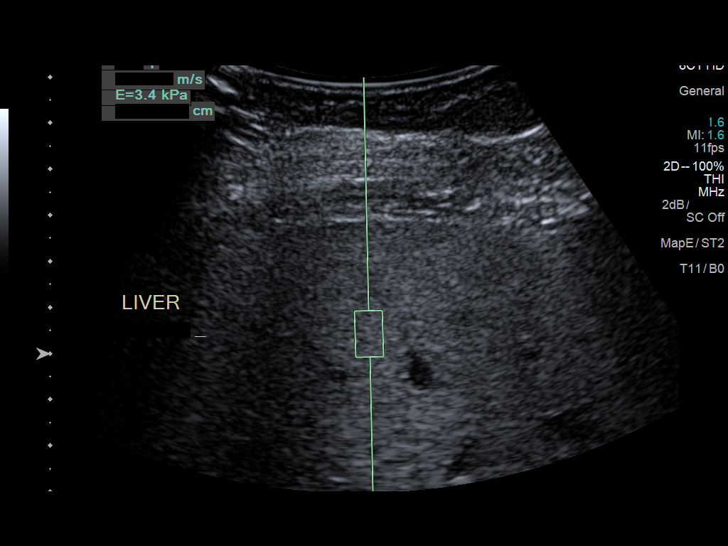
[im 6/15]
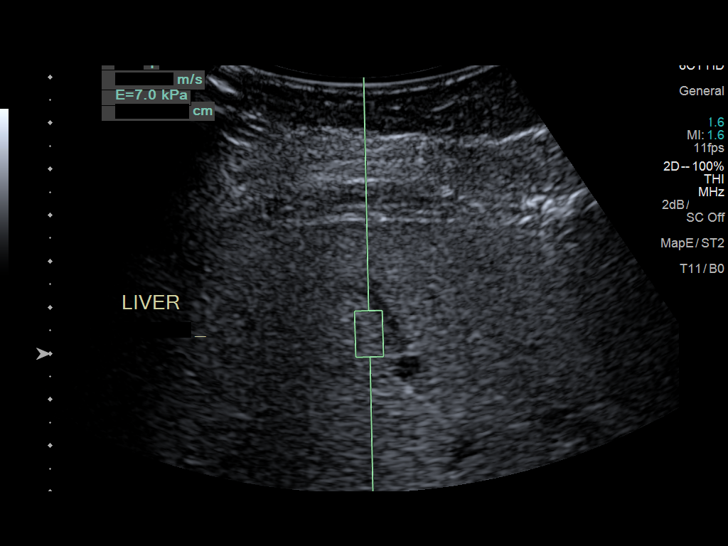
[im 7/15]
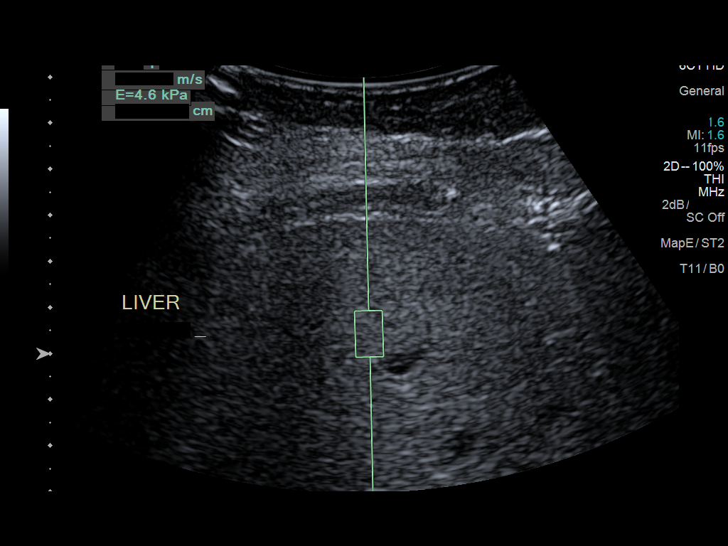
[im 8/15]
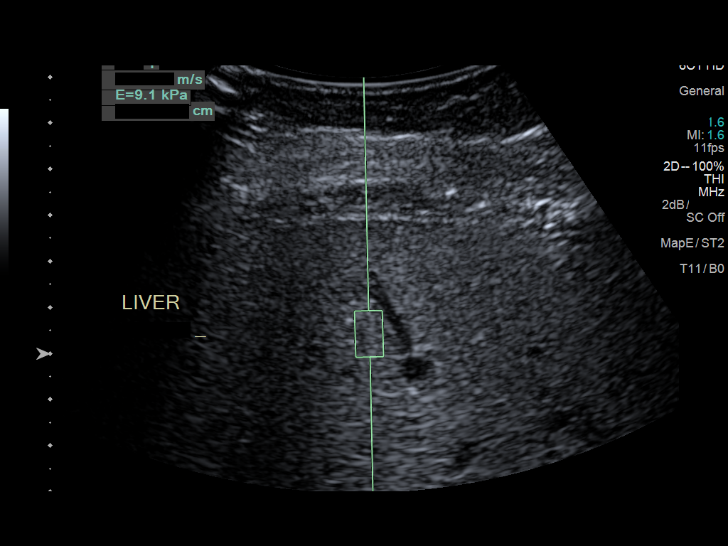
[im 9/15]
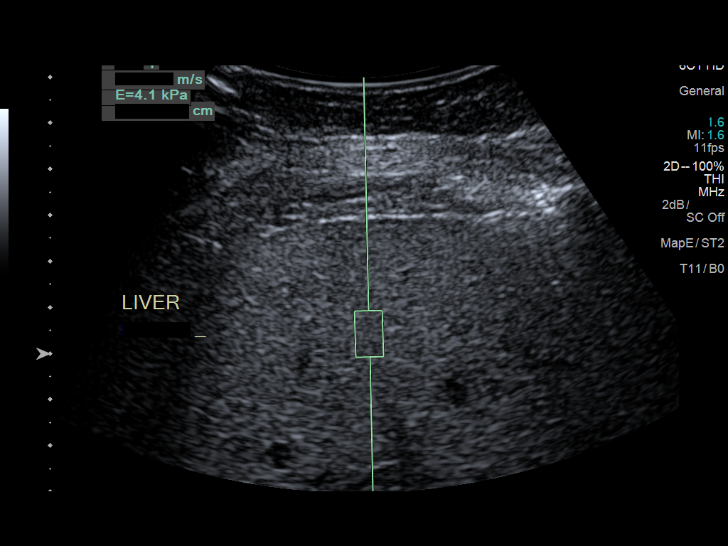
[im 10/15]
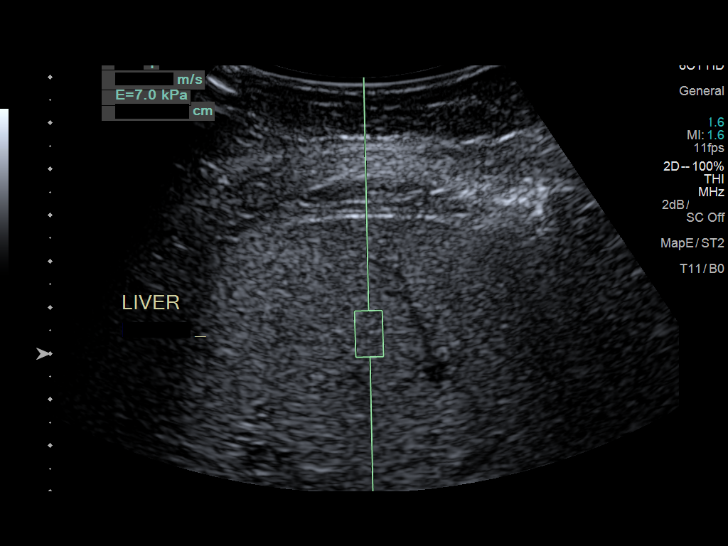
[im 11/15]
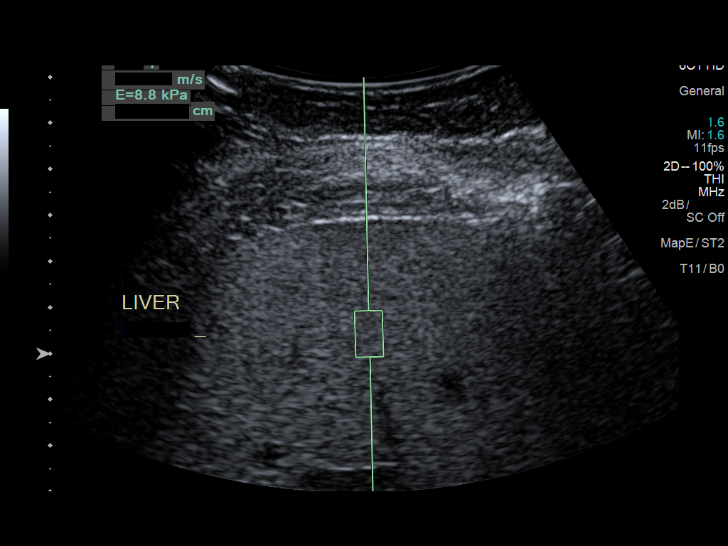
[im 13/15]
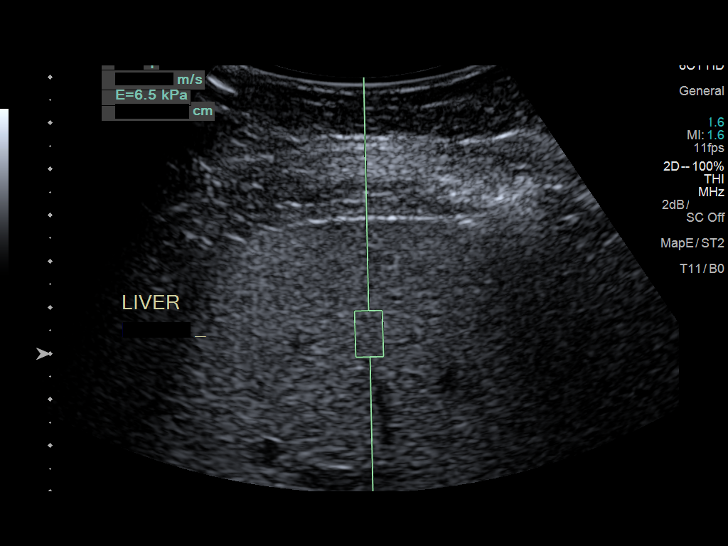
[im 14/15]
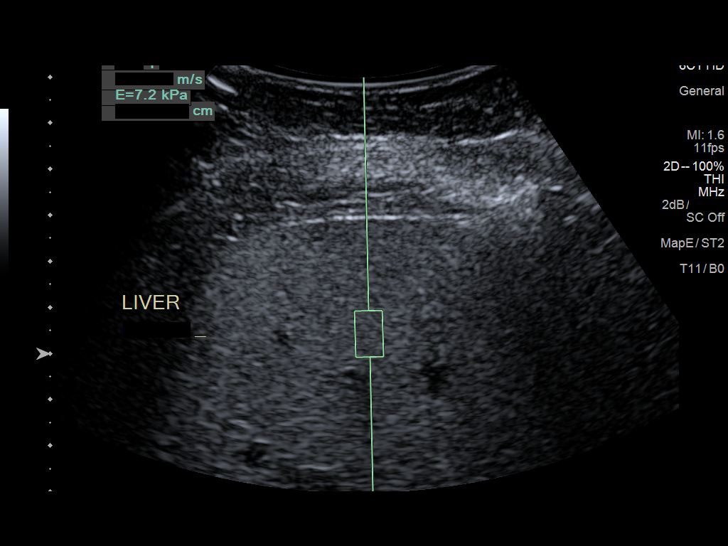
[im 15/15]
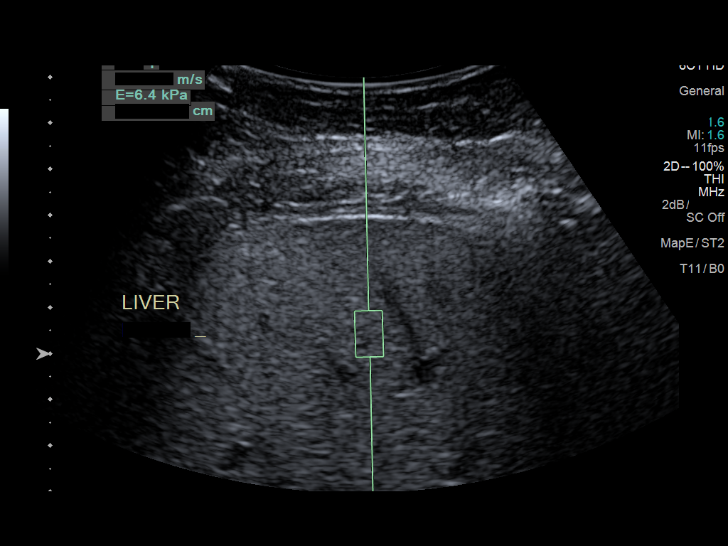

[13 of 15 positions shown; findings below may reference images not displayed]

FINDINGS: ULTRASOUND ABDOMEN

Gallbladder: No gallstones or wall thickening visualized. No
sonographic Murphy sign noted by sonographer.

Common bile duct: Diameter: 3.8 mm

Liver: There is diffuse increased echogenicity of the liver and
decreased through transmission consistent with fatty infiltration.
No focal lesions or biliary dilatation. Portal vein is patent on
color Doppler imaging with normal direction of blood flow towards
the liver.

IVC: Normal caliber

Pancreas: Visualized portion unremarkable.

Spleen: Size and appearance within normal limits.

Right Kidney: Length: 12.0 cm. Normal renal cortical thickness and
echogenicity without focal lesions or hydronephrosis.

Left Kidney: Length: 12.1 cm.

Normal renal cortical thickness and echogenicity without focal
lesions or hydronephrosis.

Abdominal aorta: Normal caliber.

Other findings: None.

ULTRASOUND HEPATIC ELASTOGRAPHY

Device: Siemens Helix VTQ

Patient position: Left Lateral Decubitus

Transducer 6C1

Number of measurements: 10

Hepatic segment:  8

Median velocity:   1.54 m/sec

IQR:

IQR/Median velocity ratio:

Corresponding Metavir fibrosis score:  F2 + some F3

Risk of fibrosis: Moderate

Limitations of exam: None

Please note that abnormal shear wave velocities may also be
identified in clinical settings other than with hepatic fibrosis,
such as: acute hepatitis, elevated right heart and central venous
pressures including use of beta blockers, Leff disease
([place]), infiltrative processes such as
mastocytosis/amyloidosis/infiltrative tumor, extrahepatic
cholestasis, in the post-prandial state, and liver transplantation.
Correlation with patient history, laboratory data, and clinical
condition recommended.
IMPRESSION: ULTRASOUND ABDOMEN:

1. Diffuse fatty infiltration of the liver but no focal hepatic
lesions or intrahepatic biliary dilatation.
2. Normal gallbladder and normal caliber common bile duct.

ULTRASOUND HEPATIC ELASTOGRAPHY:

Median hepatic shear wave velocity is calculated at 1.54 m/sec.

Corresponding Metavir fibrosis score is F2 + some F3.

Risk of fibrosis is Moderate.

Follow-up: Additional testing appropriate

## 2021-06-13 IMAGING — US US ABDOMEN COMPLETE W/ ELASTOGRAPHY
2 series · 13 of 25 positions shown · non-contrast
Comparison: 11/22/2017

CLINICAL DATA: Hepatitis-C.  Research study.



[Series 1: us abdomen complete w/ elastography · 12 of 114 slices shown (1 of 2)]
[im 1/114]
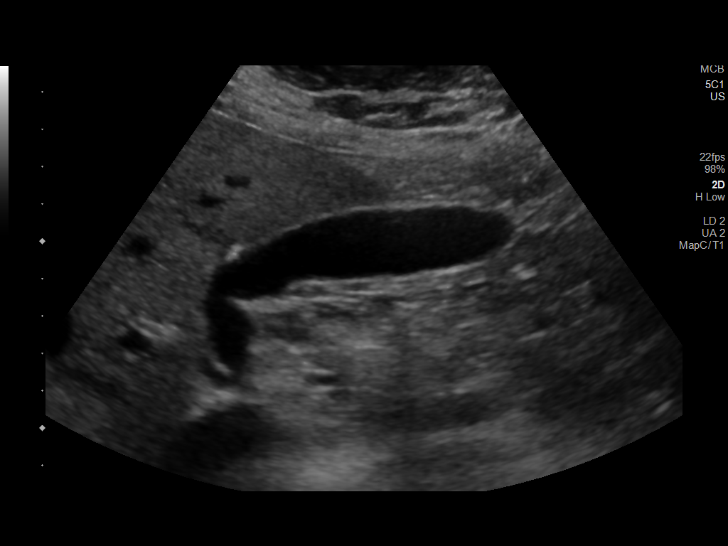
[im 10/114]
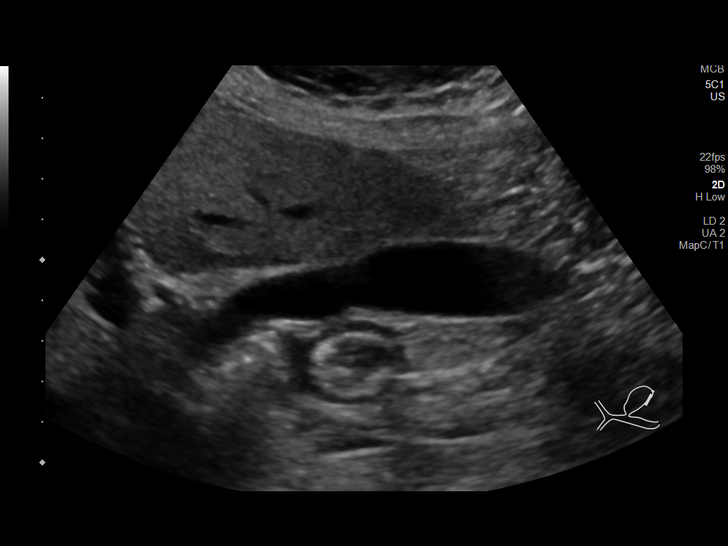
[im 20/114]
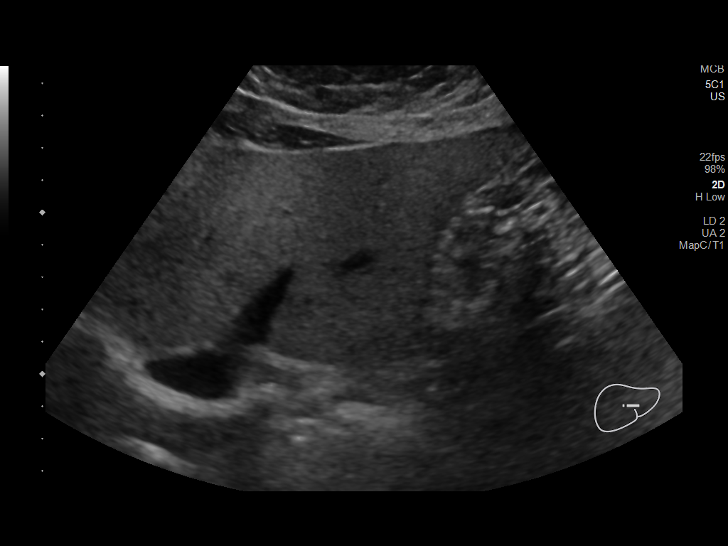
[im 30/114]
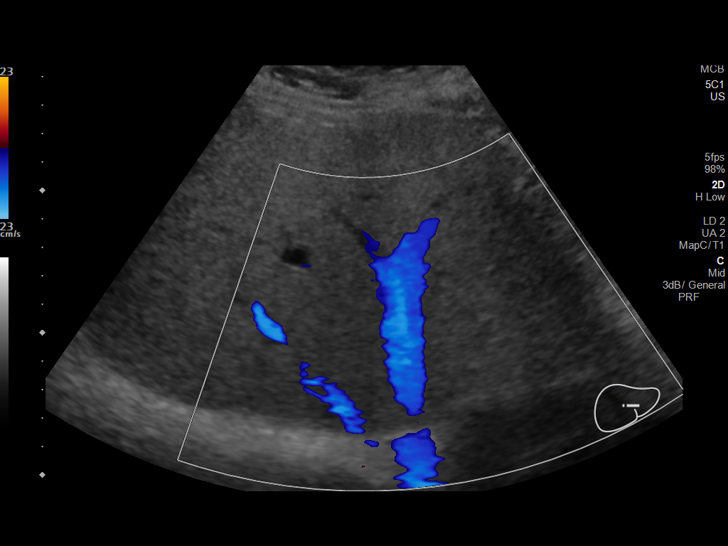
[im 40/114]
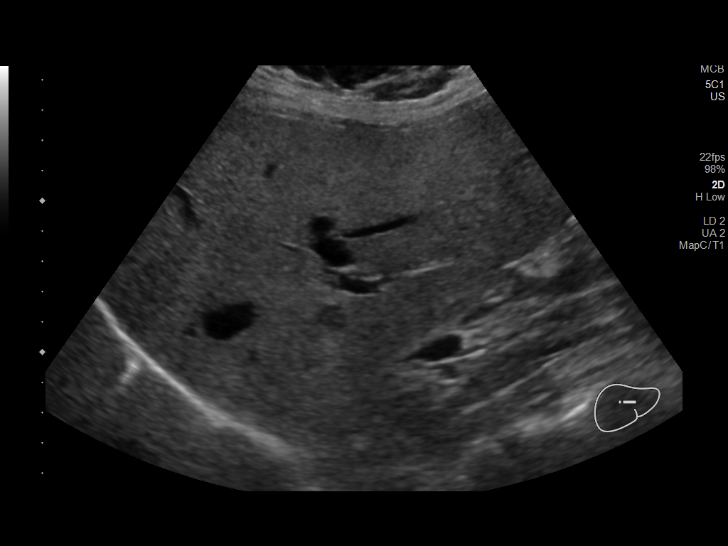
[im 50/114]
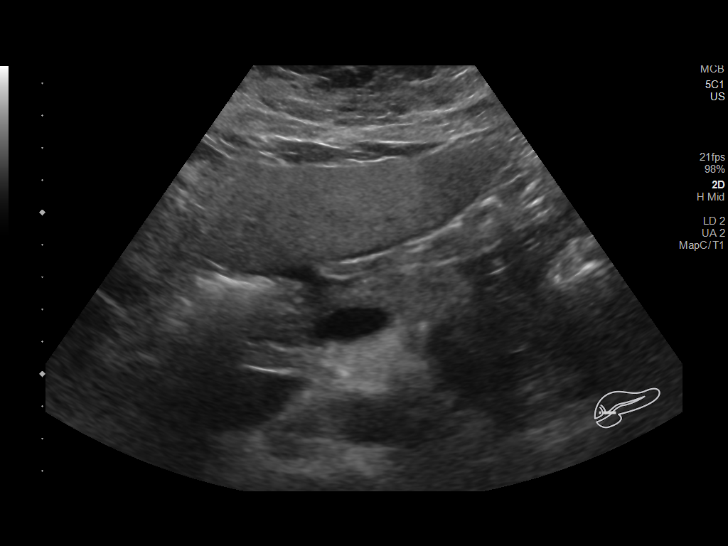
[im 59/114]
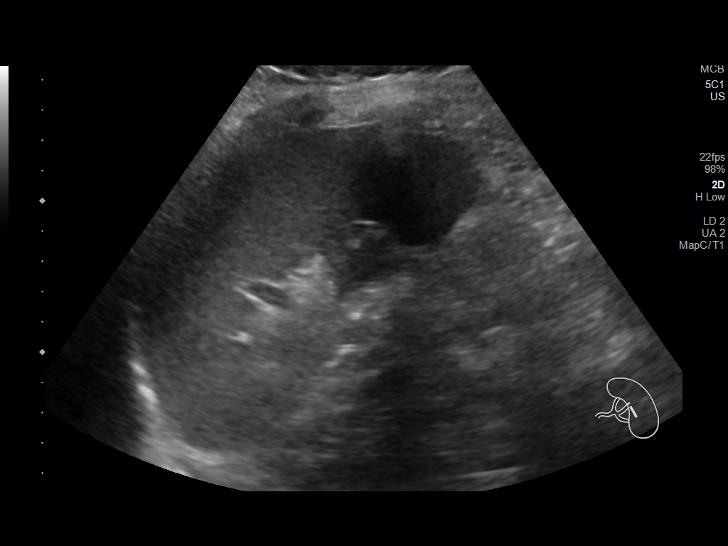
[im 69/114]
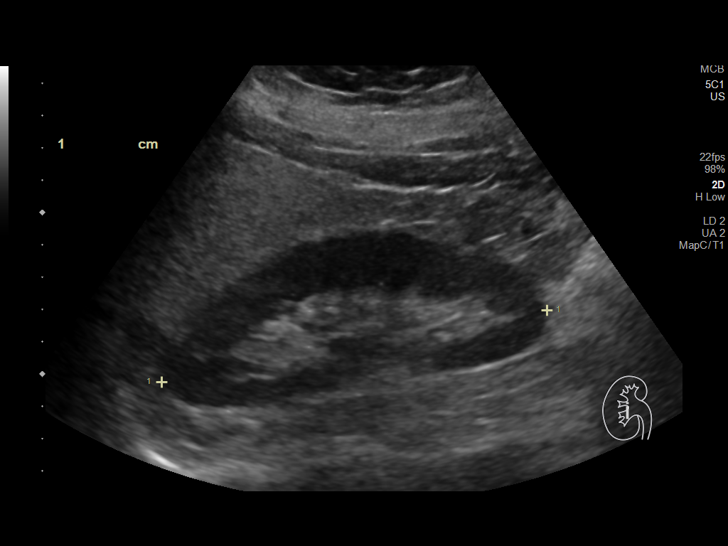
[im 79/114]
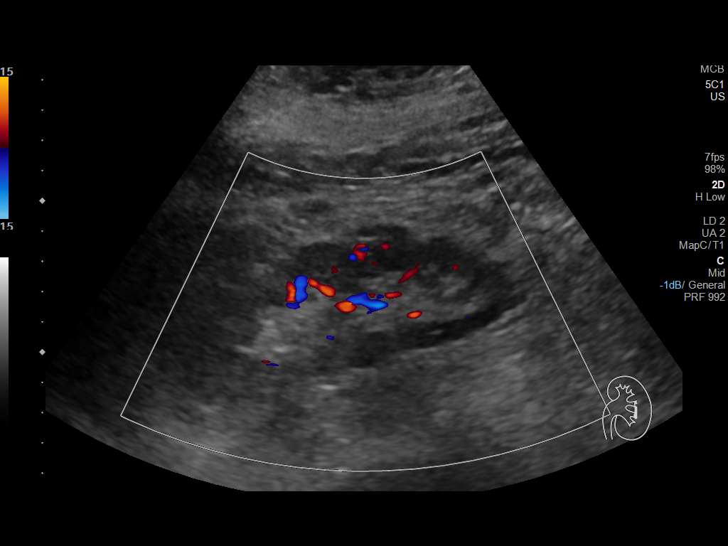
[im 89/114]
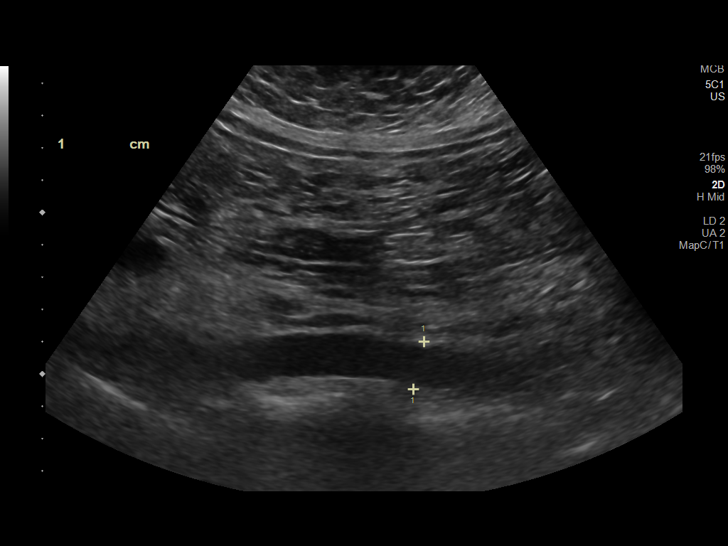
[im 99/114]
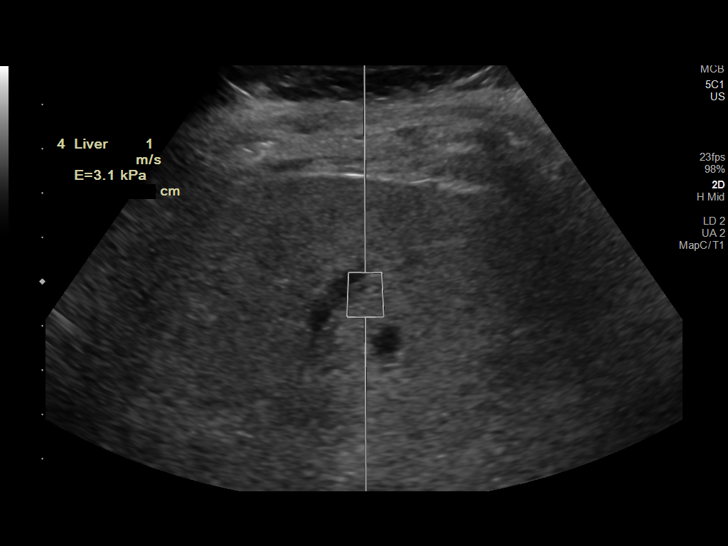
[im 109/114]
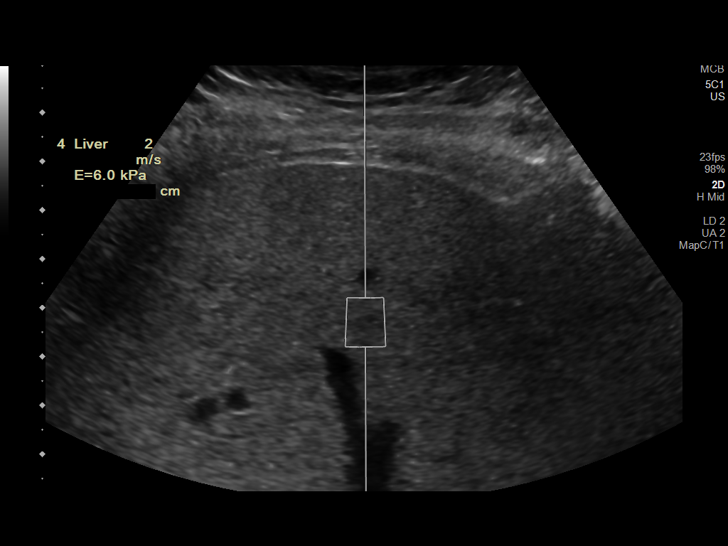

[Series 3: us abdomen complete w/ elastography · 1 of 1 slices shown (2 of 2)]
[im 1/1]
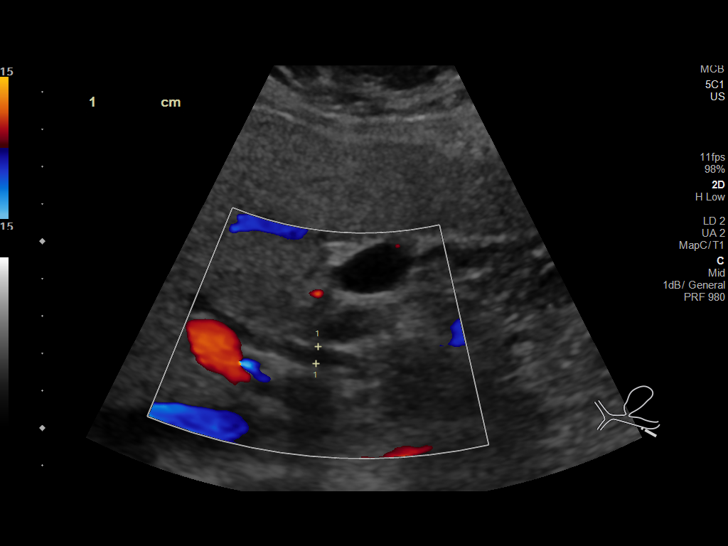

[13 of 25 positions shown; findings below may reference images not displayed]

FINDINGS: ULTRASOUND ABDOMEN

Gallbladder: No gallstones or wall thickening visualized. No
sonographic Murphy sign noted by sonographer.

Common bile duct: Diameter: 0.5 cm

Liver: No focal lesion identified. Accentuated hepatic echogenicity.
Portal vein is patent on color Doppler imaging with normal direction
of blood flow towards the liver.

IVC: No abnormality visualized.

Pancreas: Visualized portion unremarkable. Pancreatic tail not well
seen due to overlying bowel gas.

Spleen: Borderline splenomegaly, splenic length 12.8 cm and splenic
volume 441 cubic cm.

Right Kidney: Length: 12.1 cm. Echogenicity within normal limits. No
mass or hydronephrosis visualized.

Left Kidney: Length: 12.4 cm. Echogenicity within normal limits. No
mass or hydronephrosis visualized.

Abdominal aorta: No aneurysm visualized.

Other findings: None.

ULTRASOUND HEPATIC ELASTOGRAPHY

Device: Siemens Helix VTQ

Patient position: Supine

Transducer 5C1

Number of measurements: 10

Hepatic segment:  A

Median velocity:   1.04 m/sec

IQR:

IQR/Median velocity ratio:

Corresponding Metavir fibrosis score:  F0/F1

Risk of fibrosis: Minimal

Limitations of exam: None

Please note that abnormal shear wave velocities may also be
identified in clinical settings other than with hepatic fibrosis,
such as: acute hepatitis, elevated right heart and central venous
pressures including use of beta blockers, Blendion disease
([place]), infiltrative processes such as
mastocytosis/amyloidosis/infiltrative tumor, extrahepatic
cholestasis, in the post-prandial state, and liver transplantation.
Correlation with patient history, laboratory data, and clinical
condition recommended.
IMPRESSION: ULTRASOUND ABDOMEN:

1. Accentuated hepatic echogenicity, possibly from steatosis.
2. Borderline splenomegaly, splenic volume 441 cubic cm.

ULTRASOUND HEPATIC ELASTOGRAPHY:

Median hepatic shear wave velocity is calculated at 1.04 m/sec.

Corresponding Metavir fibrosis score is F0/F1.

Risk of fibrosis is minimal.

Follow-up: None required

## 2021-12-02 ENCOUNTER — Encounter (HOSPITAL_BASED_OUTPATIENT_CLINIC_OR_DEPARTMENT_OTHER): Payer: Self-pay

## 2021-12-02 ENCOUNTER — Other Ambulatory Visit: Payer: Self-pay

## 2021-12-02 ENCOUNTER — Emergency Department (HOSPITAL_BASED_OUTPATIENT_CLINIC_OR_DEPARTMENT_OTHER): Payer: Managed Care, Other (non HMO)

## 2021-12-02 ENCOUNTER — Emergency Department (HOSPITAL_BASED_OUTPATIENT_CLINIC_OR_DEPARTMENT_OTHER)
Admission: EM | Admit: 2021-12-02 | Discharge: 2021-12-02 | Disposition: A | Discharge status: Home / Self Care | Payer: Managed Care, Other (non HMO) | Attending: Emergency Medicine | Admitting: Emergency Medicine

## 2021-12-02 DIAGNOSIS — S060X0A Concussion without loss of consciousness, initial encounter: Secondary | ICD-10-CM | POA: Diagnosis not present

## 2021-12-02 DIAGNOSIS — S0990XA Unspecified injury of head, initial encounter: Secondary | ICD-10-CM

## 2021-12-02 DIAGNOSIS — W19XXXA Unspecified fall, initial encounter: Secondary | ICD-10-CM | POA: Diagnosis not present

## 2021-12-02 MED ORDER — PROCHLORPERAZINE MALEATE 10 MG PO TABS: 10.0000 mg | ORAL_TABLET | Freq: Two times a day (BID) | ORAL | 0 refills | Status: AC | PRN

## 2021-12-02 NOTE — ED Notes (Signed)
D/c paperwork reviewed with pt, incl prescription and f/u care. Pt verbalized understanding, no questions or concerns at time of d/c. Pt ambulatory with spouse to ED exit.

## 2021-12-02 NOTE — Discharge Instructions (Addendum)
Your history, exam, and work-up today were consistent with concussion likely causing your persistent headache, nausea, and symptoms.  I suspect with your history of headaches that exacerbated it.  The CT scan that you were sent for showed no evidence of fracture, dislocation, or intracranial bleeding.  We do feel you are safe for discharge home.  Please follow-up with your PCP and neurology if symptoms persist and use the Compazine to help with nausea and headache.  If you start getting restless leg type symptoms, please take some Benadryl with it.  Please rest and stay hydrated.  If any symptoms change or worsen acutely, please return to the nearest emergency department.

## 2021-12-02 NOTE — ED Triage Notes (Signed)
States slipped when getting into shower Monday night. Hit posterior head on soap dish, dried blood noted. Not on thinners. C/o headache/light sensitivity since. Denies LOC.  Seen at [facility], gave toradol shot, recommended CT scan.

## 2021-12-02 NOTE — ED Provider Notes (Signed)
[facility] EMERGENCY DEPARTMENT Provider Note   CSN: [id number] Arrival date & time: 12/02/21  1203     History  Chief Complaint  Patient presents with   Head Injury    Kelsey Phillips is a 36 y.o. female.  The history is provided by the patient. No language interpreter was used.  Head Injury Location:  Occipital Time since incident:  4 days Mechanism of injury: fall   Fall:    Fall occurred: in shower.   Impact surface:  Hard floor   Point of impact:  Head Pain details:    Quality:  Aching   Severity:  Moderate   Duration:  4 days   Timing:  Constant   Progression:  Waxing and waning Chronicity:  New Relieved by:  Nothing Worsened by:  Nothing Ineffective treatments:  None tried Associated symptoms: headache and nausea   Associated symptoms: no blurred vision, no double vision, no focal weakness, no hearing loss, no numbness, no seizures and no vomiting        Home Medications Prior to Admission medications   Medication Sig Start Date End Date Taking? Authorizing Provider  amoxicillin (AMOXIL) 500 MG capsule Take 1 capsule (500 mg total) by mouth 3 (three) times daily. Patient not taking: Reported on 11/02/2016 03/29/15   Margarita Mail, PA-C  gabapentin (NEURONTIN) 300 MG capsule Take 300 mg by mouth 2 (two) times daily. 10/05/16   [provider]  lamoTRIgine (LAMICTAL) 25 MG tablet TAKE 2 Tablets BY MOUTH EVERY MORNING AND TAKE 3 Tablets BY MOUTH EVERY NIGHT AT BEDTIME 10/05/16   [provider]  meloxicam (MOBIC) 15 MG tablet Take 1 tablet (15 mg total) by mouth daily. 10/24/16   Landis Martins, DPM  methylPREDNISolone (MEDROL DOSEPAK) 4 MG TBPK tablet Take as instructed 10/24/16   Landis Martins, DPM  oxyCODONE-acetaminophen (PERCOCET) 5-325 MG tablet Take 1-2 tablets by mouth every 4 (four) hours as needed. Patient not taking: Reported on 11/02/2016 03/29/15   Margarita Mail, PA-C  PROVENTIL HFA 108 (90 Base) MCG/ACT inhaler INHALE  2 PUFFS BY MOUTH FOUR TIMES DAILY AS NEEDED FOR COUGHING, WHEEZING, OR SHORTNESS OF BREATH 10/05/16   [provider]      Allergies    Sulfa antibiotics and Tramadol    Review of Systems   Review of Systems  Constitutional:  Negative for chills and fatigue.  HENT:  Negative for congestion and hearing loss.   Eyes:  Positive for photophobia. Negative for blurred vision, double vision and visual disturbance.  Respiratory:  Negative for cough, chest tightness and shortness of breath.   Cardiovascular:  Negative for chest pain.  Gastrointestinal:  Positive for nausea. Negative for abdominal pain, constipation, diarrhea and vomiting.  Genitourinary:  Negative for dysuria and flank pain.  Musculoskeletal:  Negative for back pain.  Skin:  Positive for wound. Negative for rash.  Neurological:  Positive for headaches. Negative for dizziness, focal weakness, seizures, syncope, facial asymmetry, speech difficulty, weakness, light-headedness and numbness.  Psychiatric/Behavioral:  Negative for agitation and confusion.   All other systems reviewed and are negative.   Physical Exam Updated Vital Signs BP (!) 137/96   Pulse 94   Temp 98.4 F (36.9 C) (Oral)   Resp 17   Ht 5\' 8"  (1.727 m)   Wt 108.4 kg   LMP 10/31/2021 (Approximate)   SpO2 94%   BMI 36.34 kg/m  Physical Exam Vitals and nursing note reviewed.  Constitutional:      General: She is not  in acute distress.    Appearance: She is well-developed. She is not ill-appearing, toxic-appearing or diaphoretic.  HENT:     Head: Abrasion and contusion present. No laceration.      Nose: Nose normal.     Mouth/Throat:     Mouth: Mucous membranes are moist.     Pharynx: No oropharyngeal exudate or posterior oropharyngeal erythema.  Eyes:     Extraocular Movements: Extraocular movements intact.     Conjunctiva/sclera: Conjunctivae normal.     Pupils: Pupils are equal, round, and reactive to light.  Neck:     Vascular: No  carotid bruit.  Cardiovascular:     Rate and Rhythm: Normal rate and regular rhythm.     Heart sounds: No murmur heard. Pulmonary:     Effort: Pulmonary effort is normal. No respiratory distress.     Breath sounds: Normal breath sounds.  Abdominal:     Palpations: Abdomen is soft.     Tenderness: There is no abdominal tenderness.  Musculoskeletal:        General: Tenderness present. No swelling.     Cervical back: Neck supple. No tenderness.     Right lower leg: No edema.     Left lower leg: No edema.  Skin:    General: Skin is warm and dry.     Capillary Refill: Capillary refill takes less than 2 seconds.     Findings: No erythema.  Neurological:     General: No focal deficit present.     Mental Status: She is alert.     Sensory: No sensory deficit.     Motor: No weakness.  Psychiatric:        Mood and Affect: Mood normal.     ED Results / Procedures / Treatments   Labs (all labs ordered are listed, but only abnormal results are displayed) Labs Reviewed - No data to display  EKG None  Radiology CT Head Wo Contrast  Result Date: 12/02/2021 CLINICAL DATA:  Fall on Monday and hit back of head. EXAM: CT HEAD WITHOUT CONTRAST CT CERVICAL SPINE WITHOUT CONTRAST TECHNIQUE: Multidetector CT imaging of the head and cervical spine was performed following the standard protocol without intravenous contrast. Multiplanar CT image reconstructions of the cervical spine were also generated. RADIATION DOSE REDUCTION: This exam was performed according to the departmental dose-optimization program which includes automated exposure control, adjustment of the mA and/or kV according to patient size and/or use of iterative reconstruction technique. COMPARISON:  None Available. FINDINGS: CT HEAD FINDINGS Brain: There is no acute intracranial hemorrhage, extra-axial fluid collection, or acute infarct. Parenchymal volume is normal. The ventricles are normal in size. Gray-white differentiation is  preserved. There is no mass lesion.  There is no mass effect or midline shift. Vascular: No hyperdense vessel or unexpected calcification. Skull: Normal. Negative for fracture or focal lesion. Sinuses/Orbits: The imaged paranasal sinuses are clear. The globes and orbits are unremarkable. Other: None. CT CERVICAL SPINE FINDINGS Alignment: There is no antero or retrolisthesis. There is no jumped or perched facet or other evidence of traumatic malalignment. Skull base and vertebrae: Skull base alignment is maintained. Vertebral body heights are preserved. There is no evidence of acute fracture. There is no suspicious osseous lesion. Soft tissues and spinal canal: No prevertebral fluid or swelling. No visible canal hematoma. Disc levels: The disc heights are preserved. There is no significant spinal canal or neural foraminal stenosis. Upper chest: The imaged lung apices are clear. Other: None. IMPRESSION: 1. No acute intracranial pathology.  2. No acute fracture or traumatic malalignment of the cervical spine. Electronically Signed   By: Valetta Mole M.D.   On: 12/02/2021 13:08   CT Cervical Spine Wo Contrast  Result Date: 12/02/2021 CLINICAL DATA:  Fall on Monday and hit back of head. EXAM: CT HEAD WITHOUT CONTRAST CT CERVICAL SPINE WITHOUT CONTRAST TECHNIQUE: Multidetector CT imaging of the head and cervical spine was performed following the standard protocol without intravenous contrast. Multiplanar CT image reconstructions of the cervical spine were also generated. RADIATION DOSE REDUCTION: This exam was performed according to the departmental dose-optimization program which includes automated exposure control, adjustment of the mA and/or kV according to patient size and/or use of iterative reconstruction technique. COMPARISON:  None Available. FINDINGS: CT HEAD FINDINGS Brain: There is no acute intracranial hemorrhage, extra-axial fluid collection, or acute infarct. Parenchymal volume is normal. The ventricles  are normal in size. Gray-white differentiation is preserved. There is no mass lesion.  There is no mass effect or midline shift. Vascular: No hyperdense vessel or unexpected calcification. Skull: Normal. Negative for fracture or focal lesion. Sinuses/Orbits: The imaged paranasal sinuses are clear. The globes and orbits are unremarkable. Other: None. CT CERVICAL SPINE FINDINGS Alignment: There is no antero or retrolisthesis. There is no jumped or perched facet or other evidence of traumatic malalignment. Skull base and vertebrae: Skull base alignment is maintained. Vertebral body heights are preserved. There is no evidence of acute fracture. There is no suspicious osseous lesion. Soft tissues and spinal canal: No prevertebral fluid or swelling. No visible canal hematoma. Disc levels: The disc heights are preserved. There is no significant spinal canal or neural foraminal stenosis. Upper chest: The imaged lung apices are clear. Other: None. IMPRESSION: 1. No acute intracranial pathology. 2. No acute fracture or traumatic malalignment of the cervical spine. Electronically Signed   By: Valetta Mole M.D.   On: 12/02/2021 13:08    Procedures Procedures    Medications Ordered in ED Medications - No data to display  ED Course/ Medical Decision Making/ A&P                           Medical Decision Making Amount and/or Complexity of Data Reviewed Radiology: ordered.    Mee Cherney is a 36 y.o. female with a past medical history significant for asthma, hepatitis C, and previous substance abuse per the chart presents with fall and head injury.  According to patient, 4 days ago on Monday, she was getting the shower when she slipped and had a mechanical fall hitting the back of her head on the soap dish.  She denied loss of consciousness but has had pain ever since.  She reports no focal neurologic deficits including no vision difficulties, speech abnormalities, or unilateral symptoms.  She reports her  headache is moderate but is still having discomfort several days into it and want to make sure she did not have any bleeding or fracture.  She went to urgent care and they sent her here for CT scans.  She reports the pain is still moderate and she has a history of headaches but this feels somewhat worse.  She denies any chest pain, back pain, or other extremity symptoms.  She has photophobia which has had in the past.  She think she may have a concussion.  On my exam, lungs clear and chest nontender.  Abdomen nontender.  Pupils are symmetric and reactive with normal extract movements.  Symmetric smile.  Clear  speech, intact sensation, strength, and pulses in extremities.  Patient has an abrasion to the back of her occipital scalp but there is no deep laceration and is not bleeding.  She reports her tetanus is up-to-date.  Patient had CT head and neck that did not show evidence of acute fracture, dislocation, or intracranial bleeding.  Clinically I suspect a postconcussive syndrome causing her symptoms.  She reports that her headache is starting to feel better after she was given some medicine at the urgent care.  She reports that she needs a work note and we will give her prescription for Compazine as well.  We discussed using Benadryl if she gets other symptoms with it.  She agrees with plan for discharge home and rest and follow-up.  She understood return precautions and was discharged in good condition after reassuring imaging.               Final Clinical Impression(s) / ED Diagnoses Final diagnoses:  Injury of head, initial encounter  Concussion without loss of consciousness, initial encounter    Rx / DC Orders ED Discharge Orders          Ordered    prochlorperazine (COMPAZINE) 10 MG tablet  2 times daily PRN        12/02/21 1448           Clinical Impression: 1. Injury of head, initial encounter   2. Concussion without loss of consciousness, initial encounter      Disposition: Discharge  Condition: Good  I have discussed the results, Dx and Tx plan with the pt(& family if present). He/she/they expressed understanding and agree(s) with the plan. Discharge instructions discussed at great length. Strict return precautions discussed and pt &/or family have verbalized understanding of the instructions. No further questions at time of discharge.    Discharge Medication List as of 12/02/2021  2:54 PM     START taking these medications   Details  prochlorperazine (COMPAZINE) 10 MG tablet Take 1 tablet (10 mg total) by mouth 2 (two) times daily as needed for nausea or vomiting., Starting Thu 12/02/2021, Print        Follow Up: Elijio Miles., MD [address] [place] [address] [phone]     [facility] [address] [place] [address] [phone]       Madden Garron, Canary Brim, MD 12/02/21 1623
